# Patient Record
Sex: Male | Born: 1980 | Race: White | Hispanic: No | Marital: Married | Smoking: Never smoker
Health system: Southern US, Community
[De-identification: ages and names within clinical notes are randomized; demographics above are authoritative.]

## PROBLEM LIST (undated history)

## (undated) DIAGNOSIS — J45909 Unspecified asthma, uncomplicated: Secondary | ICD-10-CM

## (undated) HISTORY — PX: NO PAST SURGERIES: SHX2092

---

## 2010-09-14 ENCOUNTER — Emergency Department: Payer: Self-pay | Admitting: Emergency Medicine

## 2017-07-27 ENCOUNTER — Other Ambulatory Visit: Payer: Self-pay

## 2017-07-27 ENCOUNTER — Ambulatory Visit
Admission: EM | Admit: 2017-07-27 | Discharge: 2017-07-27 | Disposition: A | Discharge status: Home / Self Care | Payer: BLUE CROSS/BLUE SHIELD | Attending: Family Medicine | Admitting: Family Medicine

## 2017-07-27 ENCOUNTER — Ambulatory Visit (INDEPENDENT_AMBULATORY_CARE_PROVIDER_SITE_OTHER): Payer: BLUE CROSS/BLUE SHIELD

## 2017-07-27 DIAGNOSIS — R059 Cough, unspecified: Secondary | ICD-10-CM

## 2017-07-27 DIAGNOSIS — R05 Cough: Secondary | ICD-10-CM | POA: Diagnosis not present

## 2017-07-27 DIAGNOSIS — J45901 Unspecified asthma with (acute) exacerbation: Secondary | ICD-10-CM

## 2017-07-27 MED ORDER — IPRATROPIUM-ALBUTEROL 0.5-2.5 (3) MG/3ML IN SOLN
3.0000 mL | Freq: Once | RESPIRATORY_TRACT | Status: AC
  Administered 2017-07-27: 3 mL via RESPIRATORY_TRACT

## 2017-07-27 MED ORDER — HYDROCOD POLST-CPM POLST ER 10-8 MG/5ML PO SUER: 5.0000 mL | Freq: Every evening | ORAL | 0 refills | Status: DC | PRN

## 2017-07-27 MED ORDER — DOXYCYCLINE HYCLATE 100 MG PO CAPS: 100.0000 mg | ORAL_CAPSULE | Freq: Two times a day (BID) | ORAL | 0 refills | Status: DC

## 2017-07-27 MED ORDER — PREDNISONE 10 MG PO TABS: ORAL_TABLET | ORAL | 0 refills | Status: DC

## 2017-07-27 MED ORDER — ALBUTEROL SULFATE HFA 108 (90 BASE) MCG/ACT IN AERS: 2.0000 | INHALATION_SPRAY | RESPIRATORY_TRACT | 0 refills | Status: DC | PRN

## 2017-07-27 NOTE — ED Provider Notes (Signed)
[facility] ____________________________________________  Time seen: Approximately 11:35 AM  I have reviewed the triage vital signs and the nursing notes.   HISTORY  Chief Complaint Cough (APPT)  HPI Jon Hardy is a 36 y.o. male presenting for evaluation of 2-3 weeks of nasal congestion, cough and chest congestion.  Patient reports that the last week he feels like the chest congestion has increased.  Patient states that he has been coughing up very thick greenish yellowish mucus.  States first thing in the morning he takes a hot shower and then tries to cough up as much mucus as he can, stating that he strains himself to cough up, and occasionally has some pinkish coloration to his mucus.  States no other pinkish mucus throughout the rest today.  Denies any other hemoptysis type episodes.  States over the last few days he has been having intermittent wheezing, chest tightness and accompanying shortness of breath.  States shortness of breath increases with coughing and wheezing sensation.  Denies chest pain.  States he does have a history of asthma, mostly as a child.  Not a smoker.  No history of PE, DVT or family history of PE or DVT.  Denies recent immobilization, recent surgeries or travel reports otherwise feels well.  Reports continues to eat and drink well.  States has tried some intermittent over-the-counter Sudafed and decongestant as well as an albuterol inhaler from 2014. Denies sick contacts. Denies abdominal pain, dysuria, extremity pain, extremity swelling or rash. Denies recent sickness. Denies recent antibiotic use.   History reviewed. No pertinent past medical history.  There are no active problems to display for this patient.   Past Surgical History:  Procedure Laterality Date  . NO PAST SURGERIES       No current facility-administered medications for this encounter.   Current Outpatient Medications:  .  albuterol (PROVENTIL HFA;VENTOLIN HFA) 108 (90  Base) MCG/ACT inhaler, Inhale 2 puffs into the lungs every 4 (four) hours as needed for wheezing or shortness of breath., Disp: 1 Inhaler, Rfl: 0 .  chlorpheniramine-HYDROcodone (TUSSIONEX PENNKINETIC ER) 10-8 MG/5ML SUER, Take 5 mLs by mouth at bedtime as needed. do not drive or operate machinery while taking as can cause drowsiness., Disp: 75 mL, Rfl: 0 .  doxycycline (VIBRAMYCIN) 100 MG capsule, Take 1 capsule (100 mg total) by mouth 2 (two) times daily., Disp: 20 capsule, Rfl: 0 .  predniSONE (DELTASONE) 10 MG tablet, Start 60 mg po day one, then 50 mg po day two, taper by 10 mg daily until complete., Disp: 21 tablet, Rfl: 0  Allergies Patient has no known allergies.  Family History  Problem Relation Age of Onset  . Hypertension Mother   . Hypertension Father     Social History Social History   Tobacco Use  . Smoking status: Never Smoker  . Smokeless tobacco: Never Used  Substance Use Topics  . Alcohol use: No    Frequency: Never  . Drug use: No    Review of Systems Constitutional: No fever/chills ENT: No sore throat. Cardiovascular: Denies chest pain. Respiratory: As above. Gastrointestinal: No abdominal pain.  No nausea, no vomiting.   Genitourinary: Negative for dysuria. Musculoskeletal: Negative for back pain.  ____________________________________________   PHYSICAL EXAM:  VITAL SIGNS: ED Triage Vitals  Enc Vitals Group     BP 07/27/17 1117 (!) 136/93     Pulse Rate 07/27/17 1117 (!) 103     Resp 07/27/17 1117 18     Temp 07/27/17 1117 98.3 F (  36.8 C)     Temp Source 07/27/17 1117 Oral     SpO2 07/27/17 1117 96 %     Weight 07/27/17 1114 290 lb (131.5 kg)     Height 07/27/17 1114 6' (1.829 m)     Head Circumference --      Peak Flow --      Pain Score 07/27/17 1114 5     Pain Loc --      Pain Edu? --      Excl. in GC? --    Vitals:   07/27/17 1114 07/27/17 1117 07/27/17 1217  BP:  (!) 136/93 137/88  Pulse:  (!) 103 (!) 109  Resp:  18 16  Temp:   98.3 F (36.8 C)   TempSrc:  Oral   SpO2:  96% 96%  Weight: 290 lb (131.5 kg)    Height: 6' (1.829 m)      Constitutional: Alert and oriented. Well appearing and in no acute distress. Eyes: Conjunctivae are normal.  Head: Atraumatic. No sinus tenderness to palpation. No swelling. No erythema.  Ears: no erythema, normal TMs bilaterally.   Nose:Nasal congestion   Mouth/Throat: Mucous membranes are moist. No pharyngeal erythema. No tonsillar swelling or exudate.  Neck: No stridor.  No cervical spine tenderness to palpation. Hematological/Lymphatic/Immunilogical: No cervical lymphadenopathy. Cardiovascular: Normal rate, regular rhythm. Grossly normal heart sounds.  Good peripheral circulation. Respiratory: Normal respiratory effort.  No retractions. Good air movement.  Scattered inspiratory and expiratory wheezes.  Mild scattered rhonchi, rhonchi increased right lower base.  Occasional dry cough with bronchospasm noted. Musculoskeletal: Ambulatory with steady gait. No cervical, thoracic or lumbar tenderness to palpation. No lower extremity tenderness or edema.  Neurologic:  Normal speech and language. No gait instability. Skin:  Skin appears warm, dry and intact. No rash noted. Psychiatric: Mood and affect are normal. Speech and behavior are normal. ___________________________________________   LABS (all labs ordered are listed, but only abnormal results are displayed)  Labs Reviewed - No data to display  RADIOLOGY  Dg Chest 2 View  Result Date: 07/27/2017 CLINICAL DATA:  Cough and wheezing EXAM: CHEST  2 VIEW COMPARISON:  None. FINDINGS: Lungs are clear. Heart size and pulmonary vascularity are normal. No adenopathy. No bone lesions. IMPRESSION: No edema or consolidation. Electronically Signed   By: Bretta BangWilliam  Woodruff III M.D.   On: 07/27/2017 12:03   ____________________________________________   PROCEDURES Procedures    INITIAL IMPRESSION / ASSESSMENT AND PLAN / ED  COURSE  Pertinent labs & imaging results that were available during my care of the patient were reviewed by me and considered in my medical decision making (see chart for details). Chest x-ray reviewed, per radiologist no edema or consolidation.   well-appearing patient.  No acute distress.  Will evaluate chest x-ray.  DuoNeb given in urgent care.  After DuoNeb, wheezes improved.  Will treat patient with oral albuterol inhaler as needed, prednisone taper, as needed Tussionex and oral doxycycline.  Encourage rest, fluids, supportive care.  Discussed very strict follow-up and return parameters.Discussed indication, risks and benefits of medications with patient.  Discussed follow up with Primary care physician this week. Discussed follow up and return parameters including no resolution or any worsening concerns. Patient verbalized understanding and agreed to plan.   ____________________________________________   FINAL CLINICAL IMPRESSION(S) / ED DIAGNOSES  Final diagnoses:  Asthmatic bronchitis with acute exacerbation, unspecified asthma severity, unspecified whether persistent  Cough     ED Discharge Orders  Ordered    albuterol (PROVENTIL HFA;VENTOLIN HFA) 108 (90 Base) MCG/ACT inhaler  Every 4 hours PRN     07/27/17 1213    predniSONE (DELTASONE) 10 MG tablet     07/27/17 1213    doxycycline (VIBRAMYCIN) 100 MG capsule  2 times daily     07/27/17 1213    chlorpheniramine-HYDROcodone (TUSSIONEX PENNKINETIC ER) 10-8 MG/5ML SUER  At bedtime PRN     07/27/17 1213       Note: This dictation was prepared with Dragon dictation along with smaller phrase technology. Any transcriptional errors that result from this process are unintentional.         Renford DillsMiller, Mylah Baynes, NP 07/27/17 1344

## 2017-07-27 NOTE — ED Triage Notes (Signed)
Patient complains of cough, congestion, shortness of breath. Patient reports history of asthma. Patient states that symptoms started week of thanksgiving and have been worsening.

## 2017-07-27 NOTE — Discharge Instructions (Signed)
Take medication as prescribed. Rest. Drink plenty of fluids.   Follow up with your primary care physician this week as needed. Return to Urgent care or Emergency for fevers, coughing up blood, chest pain, new or worsening concerns.

## 2017-08-15 ENCOUNTER — Encounter: Payer: Self-pay | Admitting: Gynecology

## 2017-08-15 ENCOUNTER — Ambulatory Visit
Admission: EM | Admit: 2017-08-15 | Discharge: 2017-08-15 | Disposition: A | Discharge status: Home / Self Care | Payer: BLUE CROSS/BLUE SHIELD | Attending: Family Medicine | Admitting: Family Medicine

## 2017-08-15 ENCOUNTER — Other Ambulatory Visit: Payer: Self-pay

## 2017-08-15 DIAGNOSIS — R05 Cough: Secondary | ICD-10-CM

## 2017-08-15 DIAGNOSIS — J069 Acute upper respiratory infection, unspecified: Secondary | ICD-10-CM

## 2017-08-15 MED ORDER — FLUTICASONE PROPIONATE 50 MCG/ACT NA SUSP: 2.0000 | Freq: Every day | NASAL | 0 refills | Status: DC

## 2017-08-15 MED ORDER — ALBUTEROL SULFATE HFA 108 (90 BASE) MCG/ACT IN AERS: 1.0000 | INHALATION_SPRAY | Freq: Four times a day (QID) | RESPIRATORY_TRACT | 0 refills | Status: DC | PRN

## 2017-08-15 MED ORDER — BENZONATATE 200 MG PO CAPS: ORAL_CAPSULE | ORAL | 0 refills | Status: DC

## 2017-08-15 NOTE — ED Triage Notes (Signed)
Per patient was seen x 2 weeks ago. Patient stated finished medication that was given and not feeling better. Pt. C/o cough and sinusitis. Per patient was not able to pick up inhaler at pharmacy because insurance does not covered.

## 2017-08-15 NOTE — ED Provider Notes (Signed)
[facility]    CSN: [id number] Arrival date & time: 08/15/17  1036     History   Chief Complaint Chief Complaint  Patient presents with  . Cough  . Sinusitis    HPI Jon Hardy is a 36 y.o. male.   HPI  This a 36 year old male who presents with continued cough and sinus drainage although he does feel improved from when he was first seen here on 12 3.  At that time he was diagnosed with an asthmatic bronchitis.  He was treated with doxycycline and prednisone albuterol inhaler which she was unable to obtain due to insurance problems and Tussionex cough syrup.  He states the majority of his problem now is in his passages but does on occasion continue to cough typically with paroxysms during the day.  Most is coughing however is in the morning.  He continues to cough yellow mucus.  He is a non-smoker.  He does not have shortness of breath.       History reviewed. No pertinent past medical history.  There are no active problems to display for this patient.   Past Surgical History:  Procedure Laterality Date  . NO PAST SURGERIES         Home Medications    Prior to Admission medications   Medication Sig Start Date End Date Taking? Authorizing Provider  albuterol (PROVENTIL HFA;VENTOLIN HFA) 108 (90 Base) MCG/ACT inhaler Inhale 1-2 puffs into the lungs every 6 (six) hours as needed for wheezing or shortness of breath. Use with spacer 08/15/17   Lutricia Feiloemer, William P, PA-C  benzonatate (TESSALON) 200 MG capsule Take one cap TID PRN cough 08/15/17   Ovid Curdoemer, William P, PA-C  fluticasone [facility](FLONASE) 50 MCG/ACT nasal spray Place 2 sprays into both nostrils daily. 08/15/17   Lutricia Feiloemer, William P, PA-C    Family History Family History  Problem Relation Age of Onset  . Hypertension Mother   . Hypertension Father     Social History Social History   Tobacco Use  . Smoking status: Never Smoker  . Smokeless tobacco: Never Used  Substance Use Topics  . Alcohol use: No     Frequency: Never  . Drug use: No     Allergies   Patient has no known allergies.   Review of Systems Review of Systems  Constitutional: Positive for activity change. Negative for chills, fatigue and fever.  Respiratory: Positive for cough.   All other systems reviewed and are negative.    Physical Exam Triage Vital Signs ED Triage Vitals  Enc Vitals Group     BP 08/15/17 1047 130/78     Pulse Rate 08/15/17 1047 (!) 103     Resp 08/15/17 1047 18     Temp 08/15/17 1047 97.8 F (36.6 C)     Temp Source 08/15/17 1047 Oral     SpO2 08/15/17 1047 98 %     Weight 08/15/17 1047 290 lb (131.5 kg)     Height --      Head Circumference --      Peak Flow --      Pain Score 08/15/17 1206 5     Pain Loc --      Pain Edu? --      Excl. in GC? --    No data found.  Updated Vital Signs BP 130/78 (BP Location: Left Arm)   Pulse (!) 103   Temp 97.8 F (36.6 C) (Oral)   Resp 18   Wt 290 lb (131.5  kg)   SpO2 98%   BMI 39.33 kg/m   Visual Acuity Right Eye Distance:   Left Eye Distance:   Bilateral Distance:    Right Eye Near:   Left Eye Near:    Bilateral Near:     Physical Exam  Constitutional: He is oriented to person, place, and time. He appears well-developed and well-nourished. No distress.  HENT:  Head: Normocephalic.  Right Ear: External ear normal.  Left Ear: External ear normal.  Nose: Nose normal.  Mouth/Throat: Oropharynx is clear and moist. No oropharyngeal exudate.  Eyes: Pupils are equal, round, and reactive to light. Right eye exhibits no discharge. Left eye exhibits no discharge.  Neck: Normal range of motion.  Pulmonary/Chest: Effort normal. He has rales.  Patient has fine crackles in the right base.  Musculoskeletal: Normal range of motion.  Lymphadenopathy:    He has no cervical adenopathy.  Neurological: He is alert and oriented to person, place, and time.  Skin: Skin is warm and dry. He is not diaphoretic.  Psychiatric: He has a normal  mood and affect. His behavior is normal. Judgment and thought content normal.  Nursing note and vitals reviewed.    UC Treatments / Results  Labs (all labs ordered are listed, but only abnormal results are displayed) Labs Reviewed - No data to display  EKG  EKG Interpretation None       Radiology No results found.  Procedures Procedures (including critical care time)  Medications Ordered in UC Medications - No data to display   Initial Impression / Assessment and Plan / UC Course  I have reviewed the triage vital signs and the nursing notes.  Pertinent labs & imaging results that were available during my care of the patient were reviewed by me and considered in my medical decision making (see chart for details).     Plan: 1. Test/x-ray results and diagnosis reviewed with patient 2. rx as per orders; risks, benefits, potential side effects reviewed with patient 3. Recommend supportive treatment with use of Nettie pot and Flonase.  Told patient I would keep this regimen for the next 3-4 weeks.  This point time am not going to place him on any other antibiotics.  This is very likely a viral illness.  I would like him to have the albuterol inhaler however he states insurance does not cover this and his plan.  I have written for another albuterol inhaler and hopefully we can find one that will be covered by his insurance.  I have recommended that he find a primary care physician that he can see in follow-up but if he is unable and he is not improving he will return to our clinic or the emergency room. 4. F/u prn if symptoms worsen or don't improve   Final Clinical Impressions(s) / UC Diagnoses   Final diagnoses:  Upper respiratory tract infection, unspecified type    ED Discharge Orders        Ordered    benzonatate (TESSALON) 200 MG capsule     08/15/17 1157    fluticasone (FLONASE) 50 MCG/ACT nasal spray  Daily     08/15/17 1157    albuterol (PROVENTIL HFA;VENTOLIN  HFA) 108 (90 Base) MCG/ACT inhaler  Every 6 hours PRN    Comments:  Provide spacer and instructions to patient   08/15/17 1157       Controlled Substance Prescriptions [place] Controlled Substance Registry consulted? Not Applicable   Lutricia FeilRoemer, William P, PA-C 08/15/17 1221

## 2017-09-14 ENCOUNTER — Emergency Department
Admission: EM | Admit: 2017-09-14 | Discharge: 2017-09-14 | Disposition: A | Discharge status: Home / Self Care | Payer: BLUE CROSS/BLUE SHIELD | Attending: Emergency Medicine | Admitting: Emergency Medicine

## 2017-09-14 ENCOUNTER — Other Ambulatory Visit: Payer: Self-pay

## 2017-09-14 ENCOUNTER — Emergency Department: Payer: BLUE CROSS/BLUE SHIELD

## 2017-09-14 ENCOUNTER — Ambulatory Visit (INDEPENDENT_AMBULATORY_CARE_PROVIDER_SITE_OTHER)
Admission: EM | Admit: 2017-09-14 | Discharge: 2017-09-14 | Disposition: A | Discharge status: Other Acute Inpatient Hospital | Payer: BLUE CROSS/BLUE SHIELD | Source: Home / Self Care | Attending: Emergency Medicine | Admitting: Emergency Medicine

## 2017-09-14 ENCOUNTER — Encounter: Payer: Self-pay | Admitting: Emergency Medicine

## 2017-09-14 DIAGNOSIS — R079 Chest pain, unspecified: Secondary | ICD-10-CM | POA: Insufficient documentation

## 2017-09-14 DIAGNOSIS — R0602 Shortness of breath: Secondary | ICD-10-CM | POA: Diagnosis not present

## 2017-09-14 DIAGNOSIS — J45909 Unspecified asthma, uncomplicated: Secondary | ICD-10-CM | POA: Insufficient documentation

## 2017-09-14 DIAGNOSIS — R110 Nausea: Secondary | ICD-10-CM | POA: Insufficient documentation

## 2017-09-14 DIAGNOSIS — Z79899 Other long term (current) drug therapy: Secondary | ICD-10-CM | POA: Diagnosis not present

## 2017-09-14 HISTORY — DX: Unspecified asthma, uncomplicated: J45.909

## 2017-09-14 LAB — CBC
HCT: 49 % (ref 40.0–52.0)
HEMOGLOBIN: 16.9 g/dL (ref 13.0–18.0)
MCH: 29.9 pg (ref 26.0–34.0)
MCHC: 34.4 g/dL (ref 32.0–36.0)
MCV: 86.9 fL (ref 80.0–100.0)
PLATELETS: 182 10*3/uL (ref 150–440)
RBC: 5.64 MIL/uL (ref 4.40–5.90)
RDW: 14.1 % (ref 11.5–14.5)
WBC: 9.9 10*3/uL (ref 3.8–10.6)

## 2017-09-14 LAB — COMPREHENSIVE METABOLIC PANEL
ALT: 59 U/L (ref 17–63)
AST: 35 U/L (ref 15–41)
Albumin: 4.7 g/dL (ref 3.5–5.0)
Alkaline Phosphatase: 85 U/L (ref 38–126)
Anion gap: 12 (ref 5–15)
BUN: 15 mg/dL (ref 6–20)
CO2: 21 mmol/L — AB (ref 22–32)
CREATININE: 1.24 mg/dL (ref 0.61–1.24)
Calcium: 9.3 mg/dL (ref 8.9–10.3)
Chloride: 106 mmol/L (ref 101–111)
GFR calc non Af Amer: >60 mL/min (ref >60)
Glucose, Bld: 118 mg/dL — ABNORMAL HIGH (ref 65–99)
POTASSIUM: 3.8 mmol/L (ref 3.5–5.1)
Sodium: 139 mmol/L (ref 135–145)
Total Bilirubin: 1 mg/dL (ref 0.3–1.2)
Total Protein: 7.6 g/dL (ref 6.5–8.1)

## 2017-09-14 LAB — TROPONIN I: Troponin I: <0.03 ng/mL (ref <0.03)

## 2017-09-14 MED ORDER — ASPIRIN 81 MG PO CHEW: 324.0000 mg | CHEWABLE_TABLET | Freq: Once | ORAL | Status: DC

## 2017-09-14 MED ORDER — IOPAMIDOL (ISOVUE-370) INJECTION 76%
75.0000 mL | Freq: Once | INTRAVENOUS | Status: AC | PRN
  Administered 2017-09-14: 75 mL via INTRAVENOUS
  Filled 2017-09-14: qty 75

## 2017-09-14 MED ORDER — ASPIRIN 81 MG PO CHEW
324.0000 mg | CHEWABLE_TABLET | Freq: Once | ORAL | Status: AC
  Administered 2017-09-14: 324 mg via ORAL

## 2017-09-14 NOTE — Discharge Instructions (Signed)
I have given you aspirin here.  Go immediately to the [facility].  I am going to let them know that you are on your way.  Let them know if your chest pain changes or gets worse.

## 2017-09-14 NOTE — ED Notes (Signed)
Pt back in room from CT 

## 2017-09-14 NOTE — ED Notes (Signed)
Pt ambulatory at discharge. Verbalized understanding of discharge instructions and follow-up care. VSS. Skin warm and dry. A&O x4. Denying pain.

## 2017-09-14 NOTE — ED Provider Notes (Signed)
[facility] Emergency Department Provider Note ____________________________________________   I have reviewed the triage vital signs and the triage nursing note.  HISTORY  Chief Complaint Chest Pain   Historian Patient  HPI Jon Hardy is a 37 y.o. male presents for evaluation of chest pain that started around 2 AM this morning while he was at work.  He presented to urgent care who referred him further for evaluation today for cardiac evaluation and evaluation of chest pain given a couple of risk factors including hyperlipidemia and hypertension.  Patient states he experienced a central chest pressure which was just left of the sternum and then spread across over to his left shoulder.  Mild nausea.  When he was looking up what this could be he came across the possibility of dizziness and when he read that he started to feel dizzy.  This morning he did go to urgent care and was referred here for further evaluation.  No history of known PE.  No history of coronary artery disease.  He does report history of bronchitis over the past 2 months, multiple rounds of prednisone which he says prevents him from getting good sleep.     Past Medical History:  Diagnosis Date  . Asthma     There are no active problems to display for this patient.   Past Surgical History:  Procedure Laterality Date  . NO PAST SURGERIES      Prior to Admission medications   Medication Sig Start Date End Date Taking? Authorizing Provider  albuterol (PROVENTIL HFA;VENTOLIN HFA) 108 (90 Base) MCG/ACT inhaler Inhale 1-2 puffs into the lungs every 6 (six) hours as needed for wheezing or shortness of breath. Use with spacer 08/15/17   Lutricia Feiloemer, William P, PA-C  fluticasone (FLONASE) 50 MCG/ACT nasal spray Place 2 sprays into both nostrils daily. 08/15/17   Lutricia Feiloemer, William P, PA-C    No Active Allergies  Family History  Problem Relation Age of Onset  . Hypertension Mother   .  Hypertension Father     Social History Social History   Tobacco Use  . Smoking status: Never Smoker  . Smokeless tobacco: Never Used  Substance Use Topics  . Alcohol use: No    Frequency: Never  . Drug use: No    Review of Systems  Constitutional: Negative for fever. Eyes: Negative for visual changes. ENT: Negative for sore throat. Cardiovascular: Positive for chest pain earlier today, states that he went walking to see if it would get worse and it did not.  No worsening with walking stairs.  Much better now, mild soreness across the top of his left chest wall when he palpates the muscles there. Respiratory: He has had a mild dry cough/bronchitis over the last several months, somewhat better now but still some mild shortness of breath. Gastrointestinal: Negative for abdominal pain, vomiting and diarrhea. Genitourinary: Negative for dysuria. Musculoskeletal: Negative for back pain. Skin: Negative for rash. Neurological: Negative for headache.  ____________________________________________   PHYSICAL EXAM:  VITAL SIGNS: ED Triage Vitals  Enc Vitals Group     BP 09/14/17 1334 (!) 142/91     Pulse Rate 09/14/17 1334 86     Resp 09/14/17 1334 16     Temp 09/14/17 1334 98.2 F (36.8 C)     Temp Source 09/14/17 1334 Oral     SpO2 09/14/17 1334 95 %     Weight 09/14/17 1347 290 lb (131.5 kg)     Height 09/14/17 1347 6' (1.829 m)  Head Circumference --      Peak Flow --      Pain Score 09/14/17 1346 3     Pain Loc --      Pain Edu? --      Excl. in GC? --      Constitutional: Alert and oriented. Well appearing and in no distress. HEENT   Head: Normocephalic and atraumatic.      Eyes: Conjunctivae are normal. Pupils equal and round.       Ears:         Nose: No congestion/rhinnorhea.   Mouth/Throat: Mucous membranes are moist.   Neck: No stridor. Cardiovascular/Chest: Normal rate, regular rhythm.  No murmurs, rubs, or gallops. Respiratory: Normal  respiratory effort without tachypnea nor retractions.  Some type breath sounds without obvious wheezing.  No rales or rhonchi. Gastrointestinal: Soft. No distention, no guarding, no rebound. Nontender.  Obese. Genitourinary/rectal:Deferred Musculoskeletal: Nontender with normal range of motion in all extremities. No joint effusions.  No lower extremity tenderness.  No edema. Neurologic:  Normal speech and language. No gross or focal neurologic deficits are appreciated. Skin:  Skin is warm, dry and intact. No rash noted. Psychiatric: Mood and affect are normal. Speech and behavior are normal. Patient exhibits appropriate insight and judgment.   ____________________________________________  LABS (pertinent positives/negatives) I, Governor Rooks, MD the attending physician have reviewed the labs noted below.  Labs Reviewed  COMPREHENSIVE METABOLIC PANEL - Abnormal; Notable for the following components:      Result Value   CO2 21 (*)    Glucose, Bld 118 (*)    All other components within normal limits  CBC  TROPONIN I    ____________________________________________    EKG I, Governor Rooks, MD, the attending physician have personally viewed and interpreted all ECGs.  86 bpm.  Normal sinus rhythm.  Narrow QRS.  Normal axis.  Normal ST and T wave. ____________________________________________  RADIOLOGY All Xrays were viewed by me.  Imaging interpreted by Radiologist, and I, Governor Rooks, MD the attending physician have reviewed the radiologist interpretation noted below.  Chest x-ray two-view:  FINDINGS: The heart size and mediastinal contours are within normal limits. Compared to the prior chest x-ray, there is increase in pulmonary vascular and interstitial prominence suggestive of mild interstitial edema. No focal airspace consolidation, nodule, pleural fluid or pneumothorax identified. The bony thorax is unremarkable. The visualized skeletal structures are  unremarkable.  IMPRESSION: Findings suggestive of mild pulmonary interstitial edema.  Chest CT: Pending __________________________________________  PROCEDURES  Procedure(s) performed: None  Critical Care performed: None   ____________________________________________  ED COURSE / ASSESSMENT AND PLAN  Pertinent labs & imaging results that were available during my care of the patient were reviewed by me and considered in my medical decision making (see chart for details).    Patient looks exhausted because he has been up since 10 PM last night.  He describes an episode of nonspecific chest discomfort associated with some mild nausea possibly shortness of breath although it sound like the shortness of breath has been sort of an ongoing waxing and waning issue for the past several months, that occurred essentially at rest overnight and did not worsen with any exertional activities.  His EKG is overall reassuring.  His initial laboratory workup including troponin drawn well after symptoms started at 2 AM is reassuring and negative.  I have a low suspicion for ACS, however given his symptoms and couple of risk factors, I have discussed with him that I would recommend  he have close follow-up with cardiology this week and will give him the office number for follow-up.  From the standpoint of his complaint of intermittent breathing issues, we discussed ruling out pulmonary embolism and discussed risk versus benefit of chest CT and chose to proceed.  We discussed that his chest x-ray showed the possibility of pulmonary edema, however he is not hypoxic.  Chest CT may give some more indication of this, as will his echocardiogram with a cardiologist.  He does have a history of GERD, and symptoms could have been related to that.  Patient care transferred to Dr. Don Perking at shift change.  Dispo pending CT for PE.  If negative/reassuring, may be discharged with my prepared discharge  instructions.  DIFFERENTIAL DIAGNOSIS: Differential diagnosis includes, but is not limited to, ACS, aortic dissection, pulmonary embolism, cardiac tamponade, pneumothorax, pneumonia, pericarditis, myocarditis, GI-related causes including esophagitis/gastritis, and musculoskeletal chest wall pain.    Differential includes, but is not limited to, viral syndrome, bronchitis including COPD exacerbation, pneumonia, reactive airway disease including asthma, CHF including exacerbation with or without pulmonary/interstitial edema, pneumothorax, ACS, thoracic trauma, and pulmonary embolism.  CONSULTATIONS: None  Patient / Family / Caregiver informed of clinical course, medical decision-making process, and agree with plan.   I discussed return precautions, follow-up instructions, and discharge instructions with patient and/or family.  Discharge Instructions (prepared by me if patient is dispositioned for home):  You are evaluated for chest discomfort, and although no certain cause was found, your exam and evaluation are overall reassuring in the emergency department today.  Return to the emergency room immediately for any worsening condition including new or worsening chest pain, nausea, sweats, dizziness or passing out, trouble breathing or shortness of breath, vomiting blood, dizziness or passing out, or any other symptoms concerning to you.    ___________________________________________   FINAL CLINICAL IMPRESSION(S) / ED DIAGNOSES   Final diagnoses:  Nonspecific chest pain      ___________________________________________        Note: This dictation was prepared with Dragon dictation. Any transcriptional errors that result from this process are unintentional    Governor Rooks, MD 09/14/17 [phone]

## 2017-09-14 NOTE — Discharge Instructions (Signed)
You are evaluated for chest discomfort, and although no certain cause was found, your exam and evaluation are overall reassuring in the emergency department today.  Return to the emergency room immediately for any worsening condition including new or worsening chest pain, nausea, sweats, dizziness or passing out, trouble breathing or shortness of breath, vomiting blood, dizziness or passing out, or any other symptoms concerning to you.

## 2017-09-14 NOTE — ED Notes (Signed)
Patient transported to CT 

## 2017-09-14 NOTE — ED Provider Notes (Signed)
-----------------------------------------   6:18 PM on 09/14/2017 -----------------------------------------   Blood pressure (!) 117/52, pulse 86, temperature 98.6 F (37 C), temperature source Oral, resp. rate 18, height 6' (1.829 m), weight 131.5 kg (290 lb), SpO2 98 %.  Assuming care from Dr. Shaune PollackLord of Cindi CarbonChris Hardy is a 37 y.o. male with a chief complaint of Chest Pain .    Please refer to H&P by previous MD for further details.  The current plan of care is to f/u result of CTA and 2nd troponin.   CT Angio Chest PE W/Cm &/Or Wo Cm (Final result)  Result time 09/14/17 17:14:32  Final result by Ulyses SouthwardBoles, Mark, MD (09/14/17 17:14:32)           Narrative:   CLINICAL DATA: LEFT chest pain and pressure, shortness of breath, onset of symptoms last night  EXAM: CT ANGIOGRAPHY CHEST WITH CONTRAST  TECHNIQUE: Multidetector CT imaging of the chest was performed using the standard protocol during bolus administration of intravenous contrast. Multiplanar CT image reconstructions and MIPs were obtained to evaluate the vascular anatomy.  CONTRAST: 75mL ISOVUE-370 IOPAMIDOL (ISOVUE-370) INJECTION 76% IV  COMPARISON: None  FINDINGS: Cardiovascular: Aorta normal caliber without aneurysm or dissection. Question mild enlargement of cardiac chambers. No pericardial effusion. Pulmonary arteries suboptimally opacified but grossly patent. No large or central pulmonary emboli identified. Unable to exclude small peripheral emboli by this exam.  Mediastinum/Nodes: Esophagus unremarkable. Base of cervical region normal appearance. No thoracic adenopathy.  Lungs/Pleura: Generally low lung volumes. Minimal scattered mosaic attenuation in both lungs which may represent atelectasis or minimal infiltrate. No pleural effusion or pneumothorax. No obvious pulmonary mass.  Upper Abdomen: Unremarkable  Musculoskeletal: Normal appearance  Review of the MIP images confirms the above  findings.  IMPRESSION: No large or central pulmonary emboli are identified.  Suboptimal opacification of peripheral pulmonary arterial branches, unable to completely exclude small and peripheral emboli by this study.  Low lung volumes with scattered areas of minimal mosaic attenuation in both lungs which could be related atelectasis or minimal infiltrate.   Electronically Signed By: Ulyses SouthwardMark Boles M.D. On: 09/14/2017 17:14           Discussed with patient findings of CT scan. Troponin x 2 negative. Patient remains well appearing. Will dc home with f/u with Cardiology.Discussed return precautions with patient.        Don PerkingVeronese, [place], MD 09/14/17 [phone]

## 2017-09-14 NOTE — ED Provider Notes (Signed)
HPI  SUBJECTIVE:  Jon Hardy is a 37 y.o. male who presents with left-sided chest pain described as pressure, heaviness, starting several hours ago.  He reports some shortness of breath.  He states that his breath feels tight with exhalation.  He reports nausea and states that the chest pain goes up into his left neck.  He does report belching.  No aggravating or alleviating factors.  Patient has not tried anything for this.  No diaphoresis, radiation to his arm, back.  No exertional, positional component.  No water brash, burning chest pain, abdominal pain.  No coughing, wheezing, shortness of breath.  No dyspnea on exertion.  No palpitations, presyncope, syncope.  No fevers.  He reports vomiting 48 hours prior to the symptoms starting, but was asymptomatic in between.  Has never had symptoms like this before.  No calf pain, swelling, surgery in the past 4 weeks, prolonged immobilization, hemoptysis, history of PE, DVT, cancer.  No exogenous estrogen.  He has a past medical history of obesity, asthma, states this does not feel like an asthma exacerbation.  Also borderline hypertension, hypercholesterolemia, occasional GERD.  States that does not feel like heartburn.  No history of diabetes, HIV, coronary disease, MI, atrial fibrillation, arrhythmia.  Family history negative for early MI.  PMD: None.  History reviewed. No pertinent past medical history.  Past Surgical History:  Procedure Laterality Date  . NO PAST SURGERIES      Family History  Problem Relation Age of Onset  . Hypertension Mother   . Hypertension Father     Social History   Tobacco Use  . Smoking status: Never Smoker  . Smokeless tobacco: Never Used  Substance Use Topics  . Alcohol use: No    Frequency: Never  . Drug use: No    No current facility-administered medications for this encounter.   Current Outpatient Medications:  .  albuterol (PROVENTIL HFA;VENTOLIN HFA) 108 (90 Base) MCG/ACT inhaler, Inhale 1-2  puffs into the lungs every 6 (six) hours as needed for wheezing or shortness of breath. Use with spacer, Disp: 1 Inhaler, Rfl: 0 .  fluticasone (FLONASE) 50 MCG/ACT nasal spray, Place 2 sprays into both nostrils daily., Disp: 16 g, Rfl: 0  No Known Allergies   ROS  As noted in HPI.   Physical Exam  BP (!) 166/94 (BP Location: Right Arm)   Pulse 90   Temp 97.7 F (36.5 C) (Oral)   Resp 18   Ht 6' (1.829 m)   Wt 290 lb (131.5 kg)   SpO2 100%   BMI 39.33 kg/m   Constitutional: Well developed, well nourished, no acute distress.  Obese.  Eyes: PERRL, EOMI, conjunctiva normal bilaterally HENT: Normocephalic, atraumatic,mucus membranes moist Respiratory: Clear to auscultation bilaterally, no rales, no wheezing, no rhonchi.  No chest wall tenderness. Cardiovascular: Normal rate and rhythm, no murmurs, no gallops, no rubs GI: Soft, nondistended, normal bowel sounds, nontender, no rebound, no guarding no pulsatile masses.  skin: No rash, skin intact Musculoskeletal: Calves symmetric, nontender no edema,  no deformities Neurologic: Alert & oriented x 3, CN II-XII grossly intact, no motor deficits, sensation grossly intact Psychiatric: Speech and behavior appropriate   ED Course   Medications  aspirin chewable tablet 324 mg (324 mg Oral Given 09/14/17 1244)    Orders Placed This Encounter  Procedures  . ED EKG    Standing Status:   Standing    Number of Occurrences:   1    Order Specific Question:  Reason for Exam    Answer:   Chest Pain  . EKG 12-Lead    Standing Status:   Standing    Number of Occurrences:   1   No results found for this or any previous visit (from the past 24 hour(s)). No results found.  ED Clinical Impression  Chest pain, unspecified type   ED Assessment/Plan   EKG: Sinus arrhythmia, rate 86.  Normal axis, normal intervals.  No hypertrophy.  No ST-T wave changes.  No previous EKG for comparison.  Patient was symptomatic while EKG was  obtained.  Patient has several cardiac risk factors including hypertension and hypercholesterolemia, he states that this does not feel like his asthma or like heartburn.  [facility] for a comprehensive evaluation.  Doubt PE.  Feel that he is stable to go by private vehicle.  They have agreed to go directly there.  Giving aspirin 324 mg p.o. here.  Notified the ED.  Meds ordered this encounter  Medications  . DISCONTD: aspirin chewable tablet 324 mg  . aspirin chewable tablet 324 mg    *This clinic note was created using Scientist, clinical (histocompatibility and immunogenetics)Dragon dictation software. Therefore, there may be occasional mistakes despite careful proofreading.  ?   Domenick GongMortenson, Abubakar Crispo, MD 09/14/17 1253

## 2017-09-14 NOTE — ED Triage Notes (Signed)
Chest pain began 2am during night while at work. States had hard time taking deep breath. Denies diaphoresis.

## 2017-09-14 NOTE — ED Triage Notes (Signed)
Patient c/o left sided chest pain and pressure and SOB that started last night. Patient states that he has been up since midnight at his job.

## 2017-09-15 ENCOUNTER — Encounter: Payer: Self-pay | Admitting: Cardiovascular Disease

## 2017-09-15 ENCOUNTER — Ambulatory Visit: Payer: BLUE CROSS/BLUE SHIELD | Admitting: Cardiovascular Disease

## 2017-09-15 VITALS — BP 136/84 | HR 103 | Ht 76.0 in | Wt 301.5 lb

## 2017-09-15 DIAGNOSIS — R0602 Shortness of breath: Secondary | ICD-10-CM

## 2017-09-15 DIAGNOSIS — R079 Chest pain, unspecified: Secondary | ICD-10-CM | POA: Diagnosis not present

## 2017-09-15 NOTE — Patient Instructions (Addendum)
Medication Instructions:  Your physician recommends that you continue on your current medications as directed. Please refer to the Current Medication list given to you today.   Labwork: none  Testing/Procedures: Your physician has requested that you have an echocardiogram before your treadmill test. Echocardiography is a painless test that uses sound waves to create images of your heart. It provides your doctor with information about the size and shape of your heart and how well your heart's chambers and valves are working. This procedure takes approximately one hour. There are no restrictions for this procedure.  Your physician has requested that you have an exercise tolerance test. For further information please visit [email or web address] Please also follow instruction sheet, as given.  Please wear comfortable walking shoes (ie., sneakers) Avoid caffeine and smoking 24 hours before your test.  Please bring your inhaler with you.    Follow-Up: Your physician recommends that you schedule a follow-up appointment as needed   Any Other Special Instructions Will Be Listed Below (If Applicable).     If you need a refill on your cardiac medications before your next appointment, please call your pharmacy.   Exercise Stress Electrocardiogram An exercise stress electrocardiogram is a test to check how blood flows to your heart. It is done to find areas of poor blood flow. You will need to walk on a treadmill for this test. The electrocardiogram will record your heartbeat when you are at rest and when you are exercising. What happens before the procedure?  Do not have drinks with caffeine or foods with caffeine for 24 hours before the test, or as told by your doctor. This includes coffee, tea (even decaf tea), sodas, chocolate, and cocoa.  Follow your doctor's instructions about eating and drinking before the test.  Ask your doctor what medicines you should or should not take before the  test. Take your medicines with water unless told by your doctor not to.  If you use an inhaler, bring it with you to the test.  Bring a snack to eat after the test.  Do not  smoke for 4 hours before the test.  Do not put lotions, powders, creams, or oils on your chest before the test.  Wear comfortable shoes and clothing. What happens during the procedure?  You will have patches put on your chest. Small areas of your chest may need to be shaved. Wires will be connected to the patches.  Your heart rate will be watched while you are resting and while you are exercising.  You will walk on the treadmill. The treadmill will slowly get faster to raise your heart rate.  The test will take about 1-2 hours. What happens after the procedure?  Your heart rate and blood pressure will be watched after the test.  You may return to your normal diet, activities, and medicines or as told by your doctor. This information is not intended to replace advice given to you by your health care provider. Make sure you discuss any questions you have with your health care provider. Document Released: 01/28/2008 Document Revised: 04/09/2016 Document Reviewed: 04/18/2013 Elsevier Interactive Patient Education  2018 [employer]. Echocardiogram An echocardiogram, or echocardiography, uses sound waves (ultrasound) to produce an image of your heart. The echocardiogram is simple, painless, obtained within a short period of time, and offers valuable information to your health care provider. The images from an echocardiogram can provide information such as:  Evidence of coronary artery disease (CAD).  Heart size.  Heart muscle function.  Heart valve function.  Aneurysm detection.  Evidence of a past heart attack.  Fluid buildup around the heart.  Heart muscle thickening.  Assess heart valve function.  Tell a health care provider about:  Any allergies you have.  All medicines you are taking,  including vitamins, herbs, eye drops, creams, and over-the-counter medicines.  Any problems you or family members have had with anesthetic medicines.  Any blood disorders you have.  Any surgeries you have had.  Any medical conditions you have.  Whether you are pregnant or may be pregnant. What happens before the procedure? No special preparation is needed. Eat and drink normally. What happens during the procedure?  In order to produce an image of your heart, gel will be applied to your chest and a wand-like tool (transducer) will be moved over your chest. The gel will help transmit the sound waves from the transducer. The sound waves will harmlessly bounce off your heart to allow the heart images to be captured in real-time motion. These images will then be recorded.  You may need an IV to receive a medicine that improves the quality of the pictures. What happens after the procedure? You may return to your normal schedule including diet, activities, and medicines, unless your health care provider tells you otherwise. This information is not intended to replace advice given to you by your health care provider. Make sure you discuss any questions you have with your health care provider. Document Released: 08/08/2000 Document Revised: 03/29/2016 Document Reviewed: 04/18/2013 Elsevier Interactive Patient Education  2017 [employer]

## 2017-09-15 NOTE — Progress Notes (Signed)
Cardiology Office Note   Date:  09/15/2017   ID:  Jon Hardy, DOB 10-23-1980, MRN [id number]  PCP:  Patient, No Pcp Per  Cardiologist:   Lorine Bears, MD   Chief Complaint  Patient presents with  . OTHER    CP, sob and dizziness. Meds reviewed verbally with pt.      History of Present Illness: Jon Hardy is a 37 y.o. male who was referred by Dr. Shaune Pollack at [facility] ED for evaluation of chest pain. He went to urgent care yesterday morning with chest pain and was referred to the emergency room.  He has history of hypertension and hyperlipidemia.  He had 2 urgent care visits in December for upper respiratory tract infection.  He was given a course of antibiotics and ultimately steroids.  However, he continued to have persistent cough and symptoms suggestive of sinusitis. Yesterday, he had left-sided chest pain described as soreness radiating to his left shoulder and neck.  This was associated with dizziness and nausea.  The pain was worse with deep breathing.  Workup in the ED was overall unremarkable. CTA of the chest showed no evidence of pulmonary embolism.  There was questionable enlargement of cardiac chambers.  His labs were unremarkable with 2 negative troponins. Patient is not a smoker and has no family history of premature coronary artery disease.  He has been obese all his life.  No recent change in weight.  Past Medical History:  Diagnosis Date  . Asthma     Past Surgical History:  Procedure Laterality Date  . NO PAST SURGERIES       Current Outpatient Medications  Medication Sig Dispense Refill  . albuterol (PROVENTIL HFA;VENTOLIN HFA) 108 (90 Base) MCG/ACT inhaler Inhale 1-2 puffs into the lungs every 6 (six) hours as needed for wheezing or shortness of breath. Use with spacer 1 Inhaler 0  . fluticasone (FLONASE) 50 MCG/ACT nasal spray Place 2 sprays into both nostrils daily. 16 g 0   No current facility-administered medications for this visit.     Allergies:    Patient has no active allergies.    Social History:  The patient  reports that  has never smoked. he has never used smokeless tobacco. He reports that he does not drink alcohol or use drugs.   Family History:  The patient's family history includes AAA (abdominal aortic aneurysm) in his paternal aunt; Hypertension in his father and mother.    ROS:  Please see the history of present illness.   Otherwise, review of systems are positive for none.   All other systems are reviewed and negative.    PHYSICAL EXAM: VS:  BP 136/84 (BP Location: Right Arm, Patient Position: Sitting, Cuff Size: Large)   Pulse (!) 103   Ht 6\' 4"  (1.93 m)   Wt (!) 301 lb 8 oz (136.8 kg)   BMI 36.70 kg/m  , BMI Body mass index is 36.7 kg/m. GEN: Well nourished, well developed, in no acute distress  HEENT: normal  Neck: no JVD, carotid bruits, or masses Cardiac: RRR; no murmurs, rubs, or gallops,no edema  Respiratory:  clear to auscultation bilaterally, normal work of breathing GI: soft, nontender, nondistended, + BS MS: no deformity or atrophy  Skin: warm and dry, no rash Neuro:  Strength and sensation are intact Psych: euthymic mood, full affect   EKG:  EKG is ordered today. The ekg ordered today demonstrates sinus tachycardia with no significant ST or T wave changes.   Recent Labs: 09/14/2017:  ALT 59; BUN 15; Creatinine, Ser 1.24; Hemoglobin 16.9; Platelets 182; Potassium 3.8; Sodium 139    Lipid Panel No results found for: CHOL, TRIG, HDL, CHOLHDL, VLDL, LDLCALC, LDLDIRECT    Wt Readings from Last 3 Encounters:  09/15/17 (!) 301 lb 8 oz (136.8 kg)  09/14/17 290 lb (131.5 kg)  09/14/17 290 lb (131.5 kg)       PAD Screen 09/15/2017  Previous PAD dx? No  Previous surgical procedure? No  Pain with walking? No  Feet/toe relief with dangling? No  Painful, non-healing ulcers? No  Extremities discolored? No      ASSESSMENT AND PLAN:  1.  Atypical chest pain: With a pleuritic component could  be due to recent recurrent bronchitis.  EKG with no significant ischemic changes but he is mildly tachycardic.  I requested a GXT.  2.  Shortness of breath: Likely due to recent pulmonary infection and an element of physical deconditioning related to obesity.  Some of his chest pain was pleuritic and thus I requested an echocardiogram.  3.  Persistent symptoms of upper respiratory tract infection: I advised him to establish with a primary care physician and he was provided with contact information.  4.  Obesity: I discussed with him the importance of weight loss.    Disposition:   FU with me as needed.   Signed,  Lorine BearsMuhammad Arida, MD  09/15/2017 2:31 PM    [facility]

## 2017-09-17 ENCOUNTER — Telehealth: Payer: Self-pay | Admitting: *Deleted

## 2017-09-17 NOTE — Telephone Encounter (Signed)
Spoke to patient as a friendly reminder of GXT tomorrow and to wear comfortable clothing and shoes, bring inhalers and no caffeine for 24 hours. He was appreciative and verbalized understanding.

## 2017-09-18 ENCOUNTER — Ambulatory Visit (INDEPENDENT_AMBULATORY_CARE_PROVIDER_SITE_OTHER): Payer: BLUE CROSS/BLUE SHIELD

## 2017-09-18 ENCOUNTER — Other Ambulatory Visit: Payer: Self-pay

## 2017-09-18 DIAGNOSIS — R0602 Shortness of breath: Secondary | ICD-10-CM

## 2017-09-18 DIAGNOSIS — R079 Chest pain, unspecified: Secondary | ICD-10-CM | POA: Diagnosis not present

## 2017-09-18 LAB — EXERCISE TOLERANCE TEST
CHL CUP MPHR: 182 {beats}/min
CHL CUP RESTING HR STRESS: 86 {beats}/min
Estimated workload: 10.4 METS
Exercise duration (min): 8 min
Exercise duration (sec): 56 s
Peak HR: 181 {beats}/min
Percent HR: 99 %

## 2017-09-20 ENCOUNTER — Ambulatory Visit
Admission: EM | Admit: 2017-09-20 | Discharge: 2017-09-20 | Disposition: A | Discharge status: Home / Self Care | Payer: BLUE CROSS/BLUE SHIELD | Attending: Physician Assistant | Admitting: Physician Assistant

## 2017-09-20 ENCOUNTER — Other Ambulatory Visit: Payer: Self-pay

## 2017-09-20 DIAGNOSIS — J40 Bronchitis, not specified as acute or chronic: Secondary | ICD-10-CM | POA: Diagnosis not present

## 2017-09-20 DIAGNOSIS — J019 Acute sinusitis, unspecified: Secondary | ICD-10-CM

## 2017-09-20 DIAGNOSIS — J329 Chronic sinusitis, unspecified: Secondary | ICD-10-CM

## 2017-09-20 MED ORDER — CETIRIZINE HCL 10 MG PO TABS: 10.0000 mg | ORAL_TABLET | Freq: Every day | ORAL | 0 refills | Status: DC

## 2017-09-20 MED ORDER — AMOXICILLIN-POT CLAVULANATE 875-125 MG PO TABS: 1.0000 | ORAL_TABLET | Freq: Two times a day (BID) | ORAL | 0 refills | Status: DC

## 2017-09-20 MED ORDER — GUAIFENESIN-CODEINE 100-10 MG/5ML PO SYRP: 5.0000 mL | ORAL_SOLUTION | Freq: Three times a day (TID) | ORAL | 0 refills | Status: DC | PRN

## 2017-09-20 NOTE — ED Triage Notes (Signed)
Pt reports he has been seen for similar multiple times since November. Has been seen here 3 x and also went to ER and f/u with cardiology last week for chest pain. "I'm just not getting better". Reports yellow nasal drainage and coughing up yellow. Leaving to go out of town tonight and wants to get something before he leaves. No pain today.

## 2017-09-20 NOTE — ED Provider Notes (Signed)
[facility]    CSN: [id number] Arrival date & time: 09/20/17  1348     History   Chief Complaint Chief Complaint  Patient presents with  . Cough    HPI Jon Hardy is a 37 y.o. male.   Patient is a 37 year old male who presents with complaint of congestion, cough, and drainage.  Patient has been seen at this clinic x3, ER x1, and with cardiology this past week with similar related symptoms.  The first 2 visits here, patient was treated with Flonase, Tessalon and the second visit albuterol was added.  Patient was seen last Monday, 1/21, with complaint of some shortness of breath and chest discomfort.  Those he thought were associated with the headache continued symptoms but given his history, there was concern for acute coronary issues so patient was transferred to the ER.  Workup in the ER showed normal troponins.  A chest x-ray was concerning for some possible interstitial edema.  A CT angiogram of the chest showed no PEs.  Patient was seen by cardiologist week in regards to his chest discomfort.  He had a stress test done which appears normal.  Echocardiogram showed some mild LVH but otherwise normal function.  Cardiology notes that believes that the patient's discomfort issues have been related to a upper respiratory and pulmonary infection/bronchitis.  Patient reports he will feel okay in the morning but As it goes later in the day and at night he will have worsening congestion and cough, especially at night when laying down.  He states he will use a Nettie pot in the morning with return of yellowish/orange mucus.  Patient is also using Flonase twice daily.  Patient reports she is going out of town for 3 weeks for work training and is wanting to get something to help finally get over this.      Past Medical History:  Diagnosis Date  . Asthma     There are no active problems to display for this patient.   Past Surgical History:  Procedure Laterality Date  . NO  PAST SURGERIES         Home Medications    Prior to Admission medications   Medication Sig Start Date End Date Taking? Authorizing Provider  albuterol (PROVENTIL HFA;VENTOLIN HFA) 108 (90 Base) MCG/ACT inhaler Inhale 1-2 puffs into the lungs every 6 (six) hours as needed for wheezing or shortness of breath. Use with spacer 08/15/17   Lutricia Feiloemer, William P, PA-C  amoxicillin-clavulanate (AUGMENTIN) 875-125 MG tablet Take 1 tablet by mouth every 12 (twelve) hours. 09/20/17   Candis SchatzHarris, Billy Rocco D, PA-C  cetirizine (ZYRTEC) 10 MG tablet Take 1 tablet (10 mg total) by mouth daily. 09/20/17   Candis SchatzHarris, Sonu Kruckenberg D, PA-C  fluticasone (FLONASE) 50 MCG/ACT nasal spray Place 2 sprays into both nostrils daily. 08/15/17   Lutricia Feiloemer, William P, PA-C    Family History Family History  Problem Relation Age of Onset  . Hypertension Mother   . Hypertension Father   . AAA (abdominal aortic aneurysm) Paternal Aunt     Social History Social History   Tobacco Use  . Smoking status: Never Smoker  . Smokeless tobacco: Never Used  Substance Use Topics  . Alcohol use: No    Frequency: Never  . Drug use: No     Allergies   Patient has no known allergies.   Review of Systems Review of Systems  As noted above in HPI.  Other systems reviewed and found to be negative   Physical  Exam Triage Vital Signs ED Triage Vitals  Enc Vitals Group     BP 09/20/17 1406 (!) 148/88     Pulse Rate 09/20/17 1406 98     Resp 09/20/17 1406 20     Temp 09/20/17 1406 98.4 F (36.9 C)     Temp Source 09/20/17 1406 Oral     SpO2 09/20/17 1406 100 %     Weight 09/20/17 1409 (!) 301 lb (136.5 kg)     Height 09/20/17 1409 6' (1.829 m)     Head Circumference --      Peak Flow --      Pain Score 09/20/17 1408 0     Pain Loc --      Pain Edu? --      Excl. in GC? --    No data found.  Updated Vital Signs BP (!) 148/88 (BP Location: Right Arm)   Pulse 98   Temp 98.4 F (36.9 C) (Oral)   Resp 20   Ht 6' (1.829 m)   Wt  (!) 301 lb (136.5 kg)   SpO2 100%   BMI 40.82 kg/m   Physical Exam  Constitutional: He is oriented to person, place, and time. He appears well-developed and well-nourished. No distress.  HENT:  Right Ear: Tympanic membrane and ear canal normal.  Left Ear: Tympanic membrane and ear canal normal.  Nose: Right sinus exhibits maxillary sinus tenderness. Left sinus exhibits maxillary sinus tenderness.  Mouth/Throat: Uvula is midline.  Some postnasal drainage.  Eyes: EOM are normal. Pupils are equal, round, and reactive to light.  Neck: Normal range of motion. Neck supple.  Cardiovascular: Normal rate, regular rhythm and normal heart sounds. Exam reveals no friction rub.  No murmur heard. Pulmonary/Chest: Effort normal and breath sounds normal. No respiratory distress.  Abdominal: Soft. Bowel sounds are normal.  Musculoskeletal: Normal range of motion.  Lymphadenopathy:    He has no cervical adenopathy.  Neurological: He is alert and oriented to person, place, and time. No cranial nerve deficit.  Skin: Skin is warm and dry.     UC Treatments / Results  Labs (all labs ordered are listed, but only abnormal results are displayed) Labs Reviewed - No data to display  EKG  EKG Interpretation None       Radiology No results found.  Procedures Procedures (including critical care time)  Medications Ordered in UC Medications - No data to display   Initial Impression / Assessment and Plan / UC Course  I have reviewed the triage vital signs and the nursing notes.  Pertinent labs & imaging results that were available during my care of the patient were reviewed by me and considered in my medical decision making (see chart for details).    Patient reported yellowish drainage with Nettie pot daily despite Flonase use.  Patient signs symptoms also consistent with continued upper respiratory issues.  We will go ahead and give her a prescription for Augmentin.  We will also add Zyrtec  to his twice daily Flonase. Final Clinical Impressions(s) / UC Diagnoses   Final diagnoses:  Bronchitis  Sinusitis, unspecified chronicity, unspecified location    ED Discharge Orders        Ordered    amoxicillin-clavulanate (AUGMENTIN) 875-125 MG tablet  Every 12 hours     09/20/17 1449    cetirizine (ZYRTEC) 10 MG tablet  Daily     09/20/17 1449       Controlled Substance Prescriptions  Controlled Substance Registry consulted? Yes, I  have consulted the [place] Controlled Substances Registry for this patient, and feel the risk/benefit ratio today is favorable for proceeding with this prescription for a controlled substance.   Candis Schatz, PA-C 09/20/17 1455

## 2017-09-20 NOTE — Discharge Instructions (Signed)
-  Augmentin: 1 tablet twice daily -Zyrtec 1 tablet daily -Cheratussin: 5 mL 3 times a day as needed for cough -will continue the Nettie pot Flonase

## 2017-09-23 ENCOUNTER — Telehealth: Payer: Self-pay

## 2017-09-23 NOTE — Telephone Encounter (Signed)
Called to follow up with patient since visit here at [facility]. Patient instructed to call back with any questions or concerns. MAH  

## 2018-06-24 ENCOUNTER — Other Ambulatory Visit: Payer: Self-pay

## 2018-06-24 ENCOUNTER — Ambulatory Visit
Admission: EM | Admit: 2018-06-24 | Discharge: 2018-06-24 | Disposition: A | Discharge status: Home / Self Care | Payer: BLUE CROSS/BLUE SHIELD | Attending: Family Medicine | Admitting: Family Medicine

## 2018-06-24 DIAGNOSIS — J209 Acute bronchitis, unspecified: Secondary | ICD-10-CM

## 2018-06-24 MED ORDER — DOXYCYCLINE HYCLATE 100 MG PO CAPS: 100.0000 mg | ORAL_CAPSULE | Freq: Two times a day (BID) | ORAL | 0 refills | Status: DC

## 2018-06-24 MED ORDER — ALBUTEROL SULFATE HFA 108 (90 BASE) MCG/ACT IN AERS: 1.0000 | INHALATION_SPRAY | Freq: Four times a day (QID) | RESPIRATORY_TRACT | 0 refills | Status: AC | PRN

## 2018-06-24 MED ORDER — PREDNISONE 50 MG PO TABS: ORAL_TABLET | ORAL | 0 refills | Status: DC

## 2018-06-24 NOTE — ED Provider Notes (Signed)
[facility]    CSN: [id number] Arrival date & time: 06/24/18  1025  History   Chief Complaint Chief Complaint  Patient presents with  . Cough   HPI  37 year old male presents with cough.  Patient has been sick for the past 8 days.  He reports drainage, cough, congestion.  Cough is productive of thick brown/green mucus.  Reports associated shortness of breath.  No fever.  No chills.  No relief with treatments at home.  Symptoms are severe.  No known exacerbating factors.  No other complaints.  PMH, Surgical Hx, Family Hx, Social History reviewed and updated as below.  Past Medical History:  Diagnosis Date  . Asthma    Patient Active Problem List   Diagnosis Date Noted  . Bronchitis 09/20/2017   Past Surgical History:  Procedure Laterality Date  . NO PAST SURGERIES     Home Medications    Prior to Admission medications   Medication Sig Start Date End Date Taking? Authorizing Provider  albuterol (PROVENTIL HFA;VENTOLIN HFA) 108 (90 Base) MCG/ACT inhaler Inhale 1-2 puffs into the lungs every 6 (six) hours as needed for wheezing or shortness of breath. 06/24/18   Tommie Sams, DO  doxycycline (VIBRAMYCIN) 100 MG capsule Take 1 capsule (100 mg total) by mouth 2 (two) times daily. 06/24/18   Tommie Sams, DO  predniSONE (DELTASONE) 50 MG tablet 1 tablet daily x 5 days 06/24/18   Tommie Sams, DO    Family History Family History  Problem Relation Age of Onset  . Hypertension Mother   . Hypertension Father   . AAA (abdominal aortic aneurysm) Paternal Aunt     Social History Social History   Tobacco Use  . Smoking status: Never Smoker  . Smokeless tobacco: Never Used  Substance Use Topics  . Alcohol use: No    Frequency: Never  . Drug use: No     Allergies   Patient has no known allergies.   Review of Systems Review of Systems  Constitutional: Negative for fever.  HENT: Positive for congestion and postnasal drip.   Respiratory: Positive for  cough and shortness of breath.    Physical Exam Triage Vital Signs ED Triage Vitals  Enc Vitals Group     BP 06/24/18 1040 (!) 132/94     Pulse Rate 06/24/18 1040 70     Resp 06/24/18 1040 18     Temp 06/24/18 1040 98 F (36.7 C)     Temp Source 06/24/18 1040 Oral     SpO2 06/24/18 1040 99 %     Weight 06/24/18 1037 285 lb (129.3 kg)     Height 06/24/18 1037 6' (1.829 m)     Head Circumference --      Peak Flow --      Pain Score 06/24/18 1037 0     Pain Loc --      Pain Edu? --      Excl. in GC? --    Updated Vital Signs BP (!) 132/94 (BP Location: Left Arm)   Pulse 70   Temp 98 F (36.7 C) (Oral)   Resp 18   Ht 6' (1.829 m)   Wt 129.3 kg   SpO2 99%   BMI 38.65 kg/m   Visual Acuity Right Eye Distance:   Left Eye Distance:   Bilateral Distance:    Right Eye Near:   Left Eye Near:    Bilateral Near:     Physical Exam  Constitutional: He is  oriented to person, place, and time. He appears well-developed. No distress.  HENT:  Head: Normocephalic and atraumatic.  Mouth/Throat: Oropharynx is clear and moist.  Cardiovascular: Normal rate and regular rhythm.  Pulmonary/Chest: Effort normal and breath sounds normal. He has no wheezes. He has no rales.  Neurological: He is alert and oriented to person, place, and time.  Psychiatric: He has a normal mood and affect. His behavior is normal.  Nursing note and vitals reviewed.  UC Treatments / Results  Labs (all labs ordered are listed, but only abnormal results are displayed) Labs Reviewed - No data to display  EKG None  Radiology No results found.  Procedures Procedures (including critical care time)  Medications Ordered in UC Medications - No data to display  Initial Impression / Assessment and Plan / UC Course  I have reviewed the triage vital signs and the nursing notes.  Pertinent labs & imaging results that were available during my care of the patient were reviewed by me and considered in my medical  decision making (see chart for details).    37 year old male presents with acute bronchitis.  Treated with doxycycline, prednisone, albuterol.  Final Clinical Impressions(s) / UC Diagnoses   Final diagnoses:  Acute bronchitis, unspecified organism   Discharge Instructions   None    ED Prescriptions    Medication Sig Dispense Auth. Provider   doxycycline (VIBRAMYCIN) 100 MG capsule Take 1 capsule (100 mg total) by mouth 2 (two) times daily. 14 capsule Gibson Lad G, DO   predniSONE (DELTASONE) 50 MG tablet 1 tablet daily x 5 days 5 tablet Ruble Buttler G, DO   albuterol (PROVENTIL HFA;VENTOLIN HFA) 108 (90 Base) MCG/ACT inhaler Inhale 1-2 puffs into the lungs every 6 (six) hours as needed for wheezing or shortness of breath. 1 Inhaler Tommie Sams, DO     Controlled Substance Prescriptions [place] Controlled Substance Registry consulted? Not Applicable   Tommie Sams, DO 06/24/18 1118

## 2018-06-24 NOTE — ED Triage Notes (Signed)
Patient complains of cough, congestion, coughing up thick brown/green mucus x 8 days.

## 2018-07-28 ENCOUNTER — Ambulatory Visit (INDEPENDENT_AMBULATORY_CARE_PROVIDER_SITE_OTHER): Payer: BLUE CROSS/BLUE SHIELD

## 2018-07-28 ENCOUNTER — Ambulatory Visit
Admission: EM | Admit: 2018-07-28 | Discharge: 2018-07-28 | Disposition: A | Discharge status: Home / Self Care | Payer: BLUE CROSS/BLUE SHIELD | Attending: Family Medicine | Admitting: Family Medicine

## 2018-07-28 ENCOUNTER — Other Ambulatory Visit: Payer: Self-pay

## 2018-07-28 ENCOUNTER — Encounter: Payer: Self-pay | Admitting: Emergency Medicine

## 2018-07-28 DIAGNOSIS — R079 Chest pain, unspecified: Secondary | ICD-10-CM

## 2018-07-28 DIAGNOSIS — J209 Acute bronchitis, unspecified: Secondary | ICD-10-CM | POA: Insufficient documentation

## 2018-07-28 LAB — CBC WITH DIFFERENTIAL/PLATELET
Abs Immature Granulocytes: 0.01 10*3/uL (ref 0.00–0.07)
BASOS ABS: 0.1 10*3/uL (ref 0.0–0.1)
Basophils Relative: 1 %
Eosinophils Absolute: 0.3 10*3/uL (ref 0.0–0.5)
Eosinophils Relative: 4 %
HCT: 45.4 % (ref 39.0–52.0)
Hemoglobin: 16 g/dL (ref 13.0–17.0)
Immature Granulocytes: 0 %
LYMPHS ABS: 1.9 10*3/uL (ref 0.7–4.0)
Lymphocytes Relative: 24 %
MCH: 30 pg (ref 26.0–34.0)
MCHC: 35.2 g/dL (ref 30.0–36.0)
MCV: 85.2 fL (ref 80.0–100.0)
Monocytes Absolute: 0.6 10*3/uL (ref 0.1–1.0)
Monocytes Relative: 8 %
NRBC: 0 % (ref 0.0–0.2)
Neutro Abs: 5 10*3/uL (ref 1.7–7.7)
Neutrophils Relative %: 63 %
Platelets: 184 10*3/uL (ref 150–400)
RBC: 5.33 MIL/uL (ref 4.22–5.81)
RDW: 13.4 % (ref 11.5–15.5)
WBC: 7.9 10*3/uL (ref 4.0–10.5)

## 2018-07-28 LAB — BASIC METABOLIC PANEL
Anion gap: 10 (ref 5–15)
BUN: 13 mg/dL (ref 6–20)
CALCIUM: 9.1 mg/dL (ref 8.9–10.3)
CO2: 23 mmol/L (ref 22–32)
Chloride: 107 mmol/L (ref 98–111)
Creatinine, Ser: 1.18 mg/dL (ref 0.61–1.24)
GFR calc Af Amer: >60 mL/min (ref >60)
GFR calc non Af Amer: >60 mL/min (ref >60)
Glucose, Bld: 98 mg/dL (ref 70–99)
Potassium: 3.5 mmol/L (ref 3.5–5.1)
Sodium: 140 mmol/L (ref 135–145)

## 2018-07-28 LAB — TROPONIN I: Troponin I: <0.03 ng/mL (ref <0.03)

## 2018-07-28 NOTE — ED Provider Notes (Signed)
[facility] ____________________________________________  Time seen: Approximately 4:45 PM  I have reviewed the triage vital signs and the nursing notes.   HISTORY  Chief Complaint Chest Pain   HPI Jon Hardy is a 37 y.o. male past medical history of obesity, asthma, recurrent bronchitis presenting for evaluation of chest pain.  Patient reports that he has a history of some similar chest pain.  Patient describes chest discomfort as an "abnormal feeling "to the left side of his chest that is somewhat uncomfortable.  Also states he has had some increase of belching and "gassy "feeling since last night as well.  States that he works third shift and noticed this after eating around 2 AM.  States since that time the discomfort has been intermittent without specific trigger or length of time lasting.  Does report that he did additional walking and exercise at work which helped alleviate the discomfort.  Has not taken any over-the-counter medications for the same.  No accompanying shortness of breath, fevers, vomiting, pain radiation, paresthesias, neck pain, back pain or abdominal pain.  States that he slept during the day today, and when he awoke he felt the discomfort again prompting him to come in.  Patient with personal history of obesity all his life.  Denies smoking or drug use.  No personal history of hypertension, diabetes, smoking.  States the last month he has been sick with coughing and congestion complaints.  States cough has much improved and only rare now.  Does report he still gets thick greenish nasal drainage out first thing in the morning but denies any other drainage.  No sinus pressure.  States that he finally feels like he is getting over his recent sickness.  Of note patient has history of recurrent asthmatic bronchitis, in which referring to cardiology (Dr. Kirke Corin) notes, suspect this is related to his chest pain complaints.  Patient had echocardiogram, stress test  in January 2019 that per cardiology were unremarkable.     Past Medical History:  Diagnosis Date  . Asthma     Patient Active Problem List   Diagnosis Date Noted  . Bronchitis 09/20/2017    Past Surgical History:  Procedure Laterality Date  . NO PAST SURGERIES       No current facility-administered medications for this encounter.   Current Outpatient Medications:  .  albuterol (PROVENTIL HFA;VENTOLIN HFA) 108 (90 Base) MCG/ACT inhaler, Inhale 1-2 puffs into the lungs every 6 (six) hours as needed for wheezing or shortness of breath., Disp: 1 Inhaler, Rfl: 0  Allergies Patient has no known allergies.  Family History  Problem Relation Age of Onset  . Hypertension Mother   . Hypertension Father   . AAA (abdominal aortic aneurysm) Paternal Aunt   Father: a.fib.   Social History Social History   Tobacco Use  . Smoking status: Never Smoker  . Smokeless tobacco: Never Used  Substance Use Topics  . Alcohol use: No    Frequency: Never  . Drug use: No    Review of Systems Constitutional: No fever ENT: No sore throat. Cardiovascular: positive chest pain. Respiratory: Denies shortness of breath. Gastrointestinal: No abdominal pain.  Some nausea. no vomiting.  No diarrhea.  No constipation. Genitourinary: Negative for dysuria. Musculoskeletal: Negative for back pain. Skin: Negative for rash. Neurological: Negative for headaches, focal weakness or numbness.   ____________________________________________   PHYSICAL EXAM:  VITAL SIGNS: ED Triage Vitals  Enc Vitals Group     BP 07/28/18 1636 (!) 144/93  Pulse Rate 07/28/18 1636 72     Resp 07/28/18 1636 18     Temp 07/28/18 1636 97.8 F (36.6 C)     Temp Source 07/28/18 1636 Oral     SpO2 07/28/18 1636 100 %     Weight 07/28/18 1634 290 lb (131.5 kg)     Height 07/28/18 1634 6' (1.829 m)     Head Circumference --      Peak Flow --      Pain Score 07/28/18 1633 1     Pain Loc --      Pain Edu? --       Excl. in [place]? --     Constitutional: Alert and oriented. Well appearing and in no acute distress. Eyes: Conjunctivae are normal.  Head: Atraumatic.  No sinus tenderness palpation.  No swelling. No erythema.   Ears: no erythema, normal TMs bilaterally.   Nose: Minimal nasal congestion.  Mouth/Throat: Mucous membranes are moist.  Oropharynx non-erythematous.No tonsillar swelling or exudate.  Neck: No stridor.  No cervical spine tenderness to palpation. Hematological/Lymphatic/Immunilogical: No cervical lymphadenopathy. Cardiovascular: Normal rate, regular rhythm. Grossly normal heart sounds.  Good peripheral circulation. Respiratory: Normal respiratory effort.  No retractions. No wheezes, rales or rhonchi. Good air movement.  Gastrointestinal: Soft and nontender.Normal Bowel sounds.  Musculoskeletal: Steady gait.  Chest nontender to palpation.  Bilateral lower extremities no edema noted and nontender. Neurologic:  Normal speech and language. No gross focal neurologic deficits are appreciated. No gait instability. Skin:  Skin is warm, dry and intact. No rash noted. Psychiatric: Mood and affect are normal. Speech and behavior are normal.  ___________________________________________   LABS (all labs ordered are listed, but only abnormal results are displayed)  Labs Reviewed  TROPONIN I  CBC WITH DIFFERENTIAL/PLATELET  BASIC METABOLIC PANEL   ____________________________________________  EKG  ED ECG REPORT I, Renford DillsLindsey Levorn Oleski, the attending provider and Dr Judd Gaudieronty, personally viewed and interpreted this ECG.   Date: 07/28/2018  EKG Time: 1636  Rate: 67  Rhythm: normal sinus rhythm  Axis: normal  Intervals:none  ST&T Change: none Similar to previous EKG on 09/15/2017. RADIOLOGY  Dg Chest 2 View  Result Date: 07/28/2018 CLINICAL DATA:  Intermittent chest pressure. EXAM: CHEST - 2 VIEW COMPARISON:  Chest CT 09/14/2017 FINDINGS: Cardiomediastinal silhouette is normal. Mediastinal  contours appear intact. There is no evidence of focal airspace consolidation, pleural effusion or pneumothorax. Bilateral bronchiectasis with mild peribronchial thickening. Osseous structures are without acute abnormality. Soft tissues are grossly normal. IMPRESSION: Mild bronchitic changes. No evidence of lobar consolidation or pulmonary edema. Electronically Signed   By: Ted Mcalpineobrinka  Dimitrova M.D.   On: 07/28/2018 17:49   ____________________________________________ Radiology EXAM: CHEST - 2 VIEW  COMPARISON:  Chest CT 09/14/2017  FINDINGS: Cardiomediastinal silhouette is normal. Mediastinal contours appear intact.  There is no evidence of focal airspace consolidation, pleural effusion or pneumothorax. Bilateral bronchiectasis with mild peribronchial thickening.  Osseous structures are without acute abnormality. Soft tissues are grossly normal.  IMPRESSION: Mild bronchitic changes.  No evidence of lobar consolidation or pulmonary edema.   Electronically Signed   By: Ted Mcalpineobrinka  Dimitrova M.D.   On: 07/28/2018 17:49  PROCEDURES Procedures     INITIAL IMPRESSION / ASSESSMENT AND PLAN / ED COURSE  Pertinent labs & imaging results that were available during my care of the patient were reviewed by me and considered in my medical decision making (see chart for details).  Patient has been seen in ER as well as urgent care for similar  complaints in the last year.  Respiratory and bronchitic complaints with 4 visits in outpatient setting as well as 2 episodes of chest pain.  Patient was then evaluated by cardiology with subsequent negative echocardiogram and stress test in January of this year.  Patient does report he has had a recent sickness but feeling much better.  Presenting for left chest discomfort that is mild since 2 AM.  Also reports exertion helped discomfort.  At this time in discussion with patient suspect secondary to recent pulmonary sickness as with similar  previous evaluations.  Called and discussed and reviewed patient with Dr.McLean cardiologist on-call, who also agrees with initial impression.  Dr. Shirlee Latch recommends 1 troponin evaluation, as well as will evaluate chest x-ray.  Discussed this with patient and wife at bedside who agree with plan.  Patient reevaluated, and reports that he is currently pain-free.  Labs reviewed and discussed with patient, unremarkable.  Chest x-ray as above per radiologist, mild bronchitic changes, no consolidation or pulmonary edema.  Discussed these findings with patient.  Lungs clear throughout this time on exam.  Has albuterol inhaler at home, recommend to use for the next few days and continue to monitor.  Over-the-counter Tylenol and ibuprofen as needed.  Will defer prednisone use at this time his lungs clear.  Again reiterated importance to follow-up closely with cardiology, recommend to have follow-up tomorrow.  Proceed directly to the emergency room for any worsening complaints.  Encourage supportive care.  Recommend follow-up with cardiologist tomorrow.  Call first thing in the morning.  Encouraged establishing close follow-up with primary care.  Discussed follow up and return parameters including no resolution or any worsening concerns. Patient verbalized understanding and agreed to plan.   ____________________________________________   FINAL CLINICAL IMPRESSION(S) / ED DIAGNOSES  Final diagnoses:  Chest pain, unspecified type  Acute bronchitis, unspecified organism     ED Discharge Orders    None       Note: This dictation was prepared with Dragon dictation along with smaller phrase technology. Any transcriptional errors that result from this process are unintentional.         Renford Dills, NP 07/28/18 1831

## 2018-07-28 NOTE — Discharge Instructions (Addendum)
Use albuterol inhaler as discussed.  Closely monitor.  Rest.  Encouraged to have close follow-up with cardiology.  Call their office tomorrow to be seen.  Follow up with your primary care physician this week as needed. Return to Urgent care as needed.  For any worsening pain, shortness of breath or worsening complaints go directly to the emergency room.

## 2018-07-28 NOTE — ED Triage Notes (Signed)
Patient c/o intermittent chest pressure that started around 2 am this morning. He does state he has had some SOB and nausea.

## 2018-10-11 ENCOUNTER — Other Ambulatory Visit: Payer: Self-pay

## 2018-10-11 ENCOUNTER — Ambulatory Visit
Admission: EM | Admit: 2018-10-11 | Discharge: 2018-10-11 | Disposition: A | Discharge status: Home / Self Care | Payer: BLUE CROSS/BLUE SHIELD | Attending: Family Medicine | Admitting: Family Medicine

## 2018-10-11 ENCOUNTER — Encounter: Payer: Self-pay | Admitting: Emergency Medicine

## 2018-10-11 DIAGNOSIS — R030 Elevated blood-pressure reading, without diagnosis of hypertension: Secondary | ICD-10-CM

## 2018-10-11 DIAGNOSIS — J029 Acute pharyngitis, unspecified: Secondary | ICD-10-CM | POA: Diagnosis not present

## 2018-10-11 LAB — RAPID STREP SCREEN (MED CTR MEBANE ONLY): Streptococcus, Group A Screen (Direct): NEGATIVE

## 2018-10-11 NOTE — ED Triage Notes (Signed)
Patient c/o sore throat and nasal congestion that started yesterday. Denies fever. Patient has been exposed to strep last week.

## 2018-10-11 NOTE — ED Provider Notes (Signed)
[facility] ____________________________________________  Time seen: Approximately 3:46 PM  I have reviewed the triage vital signs and the nursing notes.   HISTORY  Chief Complaint Sore Throat (APPT)  HPI Jon Hardy is a 38 y.o. male presenting for evaluation of sore throat since last night.  States sore throat is currently mild.  In the last little while has started to notice some postnasal nasal congestion as well.  Denies fevers.  Overall continues to eat and drink well.  States that his wife and kids had strep last week and wanted to be evaluated to make sure no strep throat.  Overall doing well otherwise.  Denies recent sickness.  Denies chest pain or shortness of breath.  States that he did notice some rash to his bilateral forearms earlier, but states that it since resolved and no alleviating measures to do so and denies trigger.  No PCP   Past Medical History:  Diagnosis Date  . Asthma     Patient Active Problem List   Diagnosis Date Noted  . Bronchitis 09/20/2017    Past Surgical History:  Procedure Laterality Date  . NO PAST SURGERIES       No current facility-administered medications for this encounter.   Current Outpatient Medications:  .  albuterol (PROVENTIL HFA;VENTOLIN HFA) 108 (90 Base) MCG/ACT inhaler, Inhale 1-2 puffs into the lungs every 6 (six) hours as needed for wheezing or shortness of breath., Disp: 1 Inhaler, Rfl: 0  Allergies Patient has no known allergies.  Family History  Problem Relation Age of Onset  . Hypertension Mother   . Hypertension Father   . AAA (abdominal aortic aneurysm) Paternal Aunt     Social History Social History   Tobacco Use  . Smoking status: Never Smoker  . Smokeless tobacco: Never Used  Substance Use Topics  . Alcohol use: No    Frequency: Never  . Drug use: No    Review of Systems Constitutional: No fever Cardiovascular: Denies chest pain. Respiratory: Denies shortness of  breath. Gastrointestinal: No abdominal pain.   Musculoskeletal: Negative for back pain. Skin: Negative for rash.   ____________________________________________   PHYSICAL EXAM:  VITAL SIGNS: ED Triage Vitals  Enc Vitals Group     BP 10/11/18 1331 (!) 154/107     Pulse Rate 10/11/18 1331 73     Resp 10/11/18 1331 18     Temp 10/11/18 1331 98.2 F (36.8 C)     Temp Source 10/11/18 1331 Oral     SpO2 10/11/18 1331 100 %     Weight 10/11/18 1329 287 lb (130.2 kg)     Height 10/11/18 1329 6' (1.829 m)     Head Circumference --      Peak Flow --      Pain Score 10/11/18 1329 6     Pain Loc --      Pain Edu? --      Excl. in [place]? --     Constitutional: Alert and oriented. Well appearing and in no acute distress. Eyes: Conjunctivae are normal.  Head: Atraumatic. No sinus tenderness to palpation. No swelling. No erythema.  Ears: no erythema, normal TMs bilaterally.   Nose:No nasal congestion.  Mouth/Throat: Mucous membranes are moist. Mild pharyngeal erythema. No tonsillar swelling or exudate.  Neck: No stridor.  No cervical spine tenderness to palpation. Hematological/Lymphatic/Immunilogical: No cervical lymphadenopathy. Cardiovascular: Normal rate, regular rhythm. Grossly normal heart sounds.  Good peripheral circulation. Respiratory: Normal respiratory effort.  No retractions. No wheezes, rales or rhonchi.  Good air movement.  Musculoskeletal: Ambulatory with steady gait. Neurologic:  Normal speech and language. No gait instability. Skin:  Skin appears warm, dry and intact. No rash noted. Psychiatric: Mood and affect are normal. Speech and behavior are normal.  ___________________________________________   LABS (all labs ordered are listed, but only abnormal results are displayed)  Labs Reviewed  RAPID STREP SCREEN ([facility] ONLY)  CULTURE, GROUP A STREP [facility])     PROCEDURES Procedures   INITIAL IMPRESSION / ASSESSMENT AND PLAN / ED COURSE  Pertinent labs  & imaging results that were available during my care of the patient were reviewed by me and considered in my medical decision making (see chart for details).  Well-appearing patient.  No acute distress.  Sore throat since last night.  Strep negative, will culture.  Positive exposures, however exam overall well-appearing, will await strep culture.  Encourage rest, fluids, supportive care, also encouraged to establish primary for regular blood pressure monitoring.  Work note given.  Discussed follow up with Primary care physician this week. Discussed follow up and return parameters including no resolution or any worsening concerns. Patient verbalized understanding and agreed to plan.   ____________________________________________   FINAL CLINICAL IMPRESSION(S) / ED DIAGNOSES  Final diagnoses:  Pharyngitis, unspecified etiology     ED Discharge Orders    None       Note: This dictation was prepared with Dragon dictation along with smaller phrase technology. Any transcriptional errors that result from this process are unintentional.         Renford Dills, NP 10/11/18 1604

## 2018-10-11 NOTE — Discharge Instructions (Addendum)
Rest. Drink plenty of fluids.  ° °Follow up with your primary care physician this week as needed. Return to Urgent care for new or worsening concerns.  ° °

## 2018-10-14 LAB — CULTURE, GROUP A STREP (THRC)

## 2019-09-13 ENCOUNTER — Other Ambulatory Visit: Payer: Self-pay

## 2019-09-13 ENCOUNTER — Ambulatory Visit
Admission: EM | Admit: 2019-09-13 | Discharge: 2019-09-13 | Disposition: A | Discharge status: Home / Self Care | Payer: BC Managed Care – PPO | Attending: Family Medicine | Admitting: Family Medicine

## 2019-09-13 ENCOUNTER — Encounter: Payer: Self-pay | Admitting: Emergency Medicine

## 2019-09-13 DIAGNOSIS — R42 Dizziness and giddiness: Secondary | ICD-10-CM

## 2019-09-13 DIAGNOSIS — R110 Nausea: Secondary | ICD-10-CM | POA: Diagnosis present

## 2019-09-13 DIAGNOSIS — Z20822 Contact with and (suspected) exposure to covid-19: Secondary | ICD-10-CM

## 2019-09-13 MED ORDER — PANTOPRAZOLE SODIUM 40 MG PO TBEC: 40.0000 mg | DELAYED_RELEASE_TABLET | Freq: Every day | ORAL | 1 refills | Status: AC

## 2019-09-13 MED ORDER — ONDANSETRON HCL 4 MG PO TABS: 4.0000 mg | ORAL_TABLET | Freq: Three times a day (TID) | ORAL | 0 refills | Status: DC | PRN

## 2019-09-13 NOTE — ED Triage Notes (Signed)
Patient c/o nausea and  "not feeling right" x 2 weeks. He reports on Thursday he had watery diarrhea but this has resolved. Patient c/o dizziness x 1 Saturday and x 1 Sunday. He states this morning he became lightheaded, teeth started chattering and his arms feel heavy.

## 2019-09-13 NOTE — Discharge Instructions (Signed)
Healthy diet.  Medications as directed.  Get yourself a primary care doctor - [facility], [facility], [facility], [facility].  Results available in 24 to 48 hours.  Take care  Dr. Adriana Simas

## 2019-09-13 NOTE — ED Provider Notes (Signed)
[facility]    CSN: [id number] Arrival date & time: 09/13/19  0841  History   Chief Complaint Chief Complaint  Patient presents with  . Nausea  . Dizziness   HPI  39 year old male presents with multiple complaints.  Patient reports that he has not been feeling well for the past 1.5 to 2 weeks.  He reports intermittent nausea.  He is also had some episodes of belching.  He is also had an approximate 3-day history of chest pain.  He states that his chest pain has now resolved.  He states that the pain moves to different places.  Patient also reports intermittent dizziness/lightheadedness.  He has also had diarrhea.  He states that he felt poorly this morning after he felt lightheaded and felt like he was shivering.  He states that his arms feel heavy and tingling/numb.  No documented fever.  No known inciting factor.  No relieving factors.  No reported sick contacts.  Patient states that he contacted his workplace and was told to come in for evaluation.  Requesting Covid testing today.  No other complaints.  PMH, Surgical Hx, Family Hx, Social History reviewed and updated as below.  Past Medical History:  Diagnosis Date  . Asthma    Patient Active Problem List   Diagnosis Date Noted  . Bronchitis 09/20/2017   Past Surgical History:  Procedure Laterality Date  . NO PAST SURGERIES     Home Medications    Prior to Admission medications   Medication Sig Start Date End Date Taking? Authorizing Provider  albuterol (PROVENTIL HFA;VENTOLIN HFA) 108 (90 Base) MCG/ACT inhaler Inhale 1-2 puffs into the lungs every 6 (six) hours as needed for wheezing or shortness of breath. 06/24/18  Yes Macsen Nuttall G, DO  ondansetron (ZOFRAN) 4 MG tablet Take 1 tablet (4 mg total) by mouth every 8 (eight) hours as needed for nausea or vomiting. 09/13/19   Tommie Sams, DO  pantoprazole (PROTONIX) 40 MG tablet Take 1 tablet (40 mg total) by mouth daily. 09/13/19   Tommie Sams, DO    Family  History Family History  Problem Relation Age of Onset  . Hypertension Mother   . Hypertension Father   . AAA (abdominal aortic aneurysm) Paternal Aunt     Social History Social History   Tobacco Use  . Smoking status: Never Smoker  . Smokeless tobacco: Never Used  Substance Use Topics  . Alcohol use: No  . Drug use: No     Allergies   Patient has no known allergies.   Review of Systems Review of Systems  Constitutional: Positive for chills. Negative for fever.  Cardiovascular: Positive for chest pain.  Gastrointestinal: Positive for diarrhea and nausea.  Neurological: Positive for numbness.   Physical Exam Triage Vital Signs ED Triage Vitals  Enc Vitals Group     BP 09/13/19 0912 (!) 150/100     Pulse Rate 09/13/19 0912 100     Resp 09/13/19 0912 18     Temp 09/13/19 0912 98.1 F (36.7 C)     Temp Source 09/13/19 0912 Oral     SpO2 09/13/19 0912 100 %     Weight 09/13/19 0910 280 lb (127 kg)     Height 09/13/19 0910 6\' 1"  (1.854 m)     Head Circumference --      Peak Flow --      Pain Score 09/13/19 0909 0     Pain Loc --      Pain  Edu? --      Excl. in [place]? --    Updated Vital Signs BP (!) 150/100 (BP Location: Right Arm)   Pulse 100   Temp 98.1 F (36.7 C) (Oral)   Resp 18   Ht 6\' 1"  (1.854 m)   Wt 127 kg   SpO2 100%   BMI 36.94 kg/m   Visual Acuity Right Eye Distance:   Left Eye Distance:   Bilateral Distance:    Right Eye Near:   Left Eye Near:    Bilateral Near:     Physical Exam Vitals and nursing note reviewed.  Constitutional:      General: He is not in acute distress.    Appearance: Normal appearance. He is obese. He is not ill-appearing.  HENT:     Head: Normocephalic and atraumatic.  Eyes:     General:        Right eye: No discharge.     Conjunctiva/sclera: Conjunctivae normal.  Cardiovascular:     Rate and Rhythm: Regular rhythm. Tachycardia present.     Heart sounds: No murmur.  Pulmonary:     Effort: Pulmonary effort  is normal.     Breath sounds: Normal breath sounds. No wheezing, rhonchi or rales.  Abdominal:     General: There is no distension.     Palpations: Abdomen is soft.     Tenderness: There is no abdominal tenderness. There is no guarding or rebound.  Neurological:     Mental Status: He is alert.  Psychiatric:        Mood and Affect: Mood normal.        Behavior: Behavior normal.    UC Treatments / Results  Labs (all labs ordered are listed, but only abnormal results are displayed) Labs Reviewed  NOVEL CORONAVIRUS, NAA (HOSP ORDER, SEND-OUT TO REF LAB; TAT 18-24 HRS)    EKG   Radiology No results found.  Procedures Procedures (including critical care time)  Medications Ordered in UC Medications - No data to display  Initial Impression / Assessment and Plan / UC Course  I have reviewed the triage vital signs and the nursing notes.  Pertinent labs & imaging results that were available during my care of the patient were reviewed by me and considered in my medical decision making (see chart for details).    39 year old male presents with multiple complaints.  Nausea -suspect related to GERD/reflux.  Zofran as needed.  Also placing on Protonix.  Lightheadedness -etiology uncertain at this time.  Patient has numerous complaints.  I suspect that anxiety is playing a role.  Covid testing -given patient's complaints, Covid testing obtained.  Awaiting test result.  Work note given.  Advised to get himself a primary care physician.  List of local offices given.  Final Clinical Impressions(s) / UC Diagnoses   Final diagnoses:  Nausea  Lightheadedness  Encounter for laboratory testing for COVID-19 virus     Discharge Instructions     Healthy diet.  Medications as directed.  Get yourself a primary care doctor - [facility], [place], [facility], [facility].  Results available in 24 to 48 hours.  Take care  Dr. Lacinda Axon    ED Prescriptions     Medication Sig Dispense Auth. Provider   ondansetron (ZOFRAN) 4 MG tablet Take 1 tablet (4 mg total) by mouth every 8 (eight) hours as needed for nausea or vomiting. 20 tablet Prabhjot Maddux G, DO   pantoprazole (PROTONIX) 40 MG tablet Take 1  tablet (40 mg total) by mouth daily. 30 tablet Tommie Sams, DO     PDMP not reviewed this encounter.   Everlene Other [place], [place] 09/13/19 [id number]

## 2019-09-14 ENCOUNTER — Other Ambulatory Visit: Payer: Self-pay

## 2019-09-14 ENCOUNTER — Encounter: Payer: Self-pay | Admitting: Emergency Medicine

## 2019-09-14 ENCOUNTER — Emergency Department
Admission: EM | Admit: 2019-09-14 | Discharge: 2019-09-14 | Disposition: A | Discharge status: Home / Self Care | Payer: BC Managed Care – PPO | Attending: Student | Admitting: Student

## 2019-09-14 DIAGNOSIS — R9431 Abnormal electrocardiogram [ECG] [EKG]: Secondary | ICD-10-CM | POA: Diagnosis not present

## 2019-09-14 DIAGNOSIS — J45909 Unspecified asthma, uncomplicated: Secondary | ICD-10-CM | POA: Insufficient documentation

## 2019-09-14 DIAGNOSIS — R531 Weakness: Secondary | ICD-10-CM | POA: Diagnosis not present

## 2019-09-14 DIAGNOSIS — R110 Nausea: Secondary | ICD-10-CM | POA: Insufficient documentation

## 2019-09-14 DIAGNOSIS — R202 Paresthesia of skin: Secondary | ICD-10-CM | POA: Diagnosis not present

## 2019-09-14 DIAGNOSIS — R42 Dizziness and giddiness: Secondary | ICD-10-CM | POA: Diagnosis not present

## 2019-09-14 LAB — TROPONIN I (HIGH SENSITIVITY): Troponin I (High Sensitivity): 4 ng/L (ref <18)

## 2019-09-14 LAB — URINALYSIS, COMPLETE (UACMP) WITH MICROSCOPIC
Bacteria, UA: NONE SEEN
Bilirubin Urine: NEGATIVE
Glucose, UA: NEGATIVE mg/dL
Hgb urine dipstick: NEGATIVE
Ketones, ur: NEGATIVE mg/dL
Leukocytes,Ua: NEGATIVE
Nitrite: NEGATIVE
Protein, ur: NEGATIVE mg/dL
Specific Gravity, Urine: 1.02 (ref 1.005–1.030)
Squamous Epithelial / HPF: NONE SEEN (ref 0–5)
pH: 5 (ref 5.0–8.0)

## 2019-09-14 LAB — CBC
HCT: 49.2 % (ref 39.0–52.0)
Hemoglobin: 17.3 g/dL — ABNORMAL HIGH (ref 13.0–17.0)
MCH: 29.9 pg (ref 26.0–34.0)
MCHC: 35.2 g/dL (ref 30.0–36.0)
MCV: 85.1 fL (ref 80.0–100.0)
Platelets: 172 10*3/uL (ref 150–400)
RBC: 5.78 MIL/uL (ref 4.22–5.81)
RDW: 13.4 % (ref 11.5–15.5)
WBC: 9 10*3/uL (ref 4.0–10.5)
nRBC: 0 % (ref 0.0–0.2)

## 2019-09-14 LAB — BASIC METABOLIC PANEL
Anion gap: 8 (ref 5–15)
BUN: 16 mg/dL (ref 6–20)
CO2: 27 mmol/L (ref 22–32)
Calcium: 9.4 mg/dL (ref 8.9–10.3)
Chloride: 104 mmol/L (ref 98–111)
Creatinine, Ser: 1.49 mg/dL — ABNORMAL HIGH (ref 0.61–1.24)
GFR calc Af Amer: >60 mL/min (ref >60)
GFR calc non Af Amer: 58 mL/min — ABNORMAL LOW (ref >60)
Glucose, Bld: 110 mg/dL — ABNORMAL HIGH (ref 70–99)
Potassium: 4.1 mmol/L (ref 3.5–5.1)
Sodium: 139 mmol/L (ref 135–145)

## 2019-09-14 LAB — NOVEL CORONAVIRUS, NAA (HOSP ORDER, SEND-OUT TO REF LAB; TAT 18-24 HRS): SARS-CoV-2, NAA: NOT DETECTED

## 2019-09-14 LAB — GLUCOSE, CAPILLARY: Glucose-Capillary: 97 mg/dL (ref 70–99)

## 2019-09-14 NOTE — ED Provider Notes (Addendum)
[facility] Emergency Department Provider Note  ____________________________________________   First MD Initiated Contact with Patient 09/14/19 2102     (approximate)  I have reviewed the triage vital signs and the nursing notes.  History  Chief Complaint Dizziness and Nausea    HPI Jon Hardy is a 38 y.o. male who presents to the emergency department for intermittent episodes of feeling unwell.  For the last 1 to 2 weeks he has had several of these episodes.  They primarily consist of nausea, lightheadedness, and numbness/tingling sensation in his back and upper extremities.  Today's episode also included some vague discomfort in his epigastric area, 6/10, no radiation, no alleviating/aggravating factors.    Episodes typically last several minutes before spontaneously resolving.  He denies any associated chest pain or palpitations. No loss of consciousness, dizziness, or syncope.  No facial droop, speech difficulties, lateralizing weakness.  No history of arrhythmias. Had some loose stool earlier in the week.  Was seen and evaluated at urgent care yesterday for similar symptoms, COVID testing done was negative.  Started on Zofran and pantoprazole.   Past Medical Hx Past Medical History:  Diagnosis Date  . Asthma     Problem List Patient Active Problem List   Diagnosis Date Noted  . Bronchitis 09/20/2017    Past Surgical Hx Past Surgical History:  Procedure Laterality Date  . NO PAST SURGERIES      Medications Prior to Admission medications   Medication Sig Start Date End Date Taking? Authorizing Provider  albuterol (PROVENTIL HFA;VENTOLIN HFA) 108 (90 Base) MCG/ACT inhaler Inhale 1-2 puffs into the lungs every 6 (six) hours as needed for wheezing or shortness of breath. 06/24/18   Coral Spikes, DO  ondansetron (ZOFRAN) 4 MG tablet Take 1 tablet (4 mg total) by mouth every 8 (eight) hours as needed for nausea or vomiting. 09/13/19   Coral Spikes, DO  pantoprazole (PROTONIX) 40 MG tablet Take 1 tablet (40 mg total) by mouth daily. 09/13/19   Coral Spikes, DO    Allergies Patient has no known allergies.  Family Hx Family History  Problem Relation Age of Onset  . Hypertension Mother   . Hypertension Father   . AAA (abdominal aortic aneurysm) Paternal Aunt     Social Hx Social History   Tobacco Use  . Smoking status: Never Smoker  . Smokeless tobacco: Never Used  Substance Use Topics  . Alcohol use: No  . Drug use: No     Review of Systems  Constitutional: Negative for fever, chills. + lightheadedness Eyes: Negative for visual changes. ENT: Negative for sore throat. Cardiovascular: Negative for chest pain. Respiratory: Negative for shortness of breath. Gastrointestinal: + for nausea.  Genitourinary: Negative for dysuria. Musculoskeletal: Negative for leg swelling. Skin: Negative for rash. Neurological: Negative for for headaches. +tingling   Physical Exam  Vital Signs: ED Triage Vitals  Enc Vitals Group     BP 09/14/19 1803 (!) 159/104     Pulse Rate 09/14/19 1803 71     Resp 09/14/19 1803 18     Temp 09/14/19 1803 98.7 F (37.1 C)     Temp Source 09/14/19 1803 Oral     SpO2 09/14/19 1803 100 %     Weight 09/14/19 1805 279 lb 15.8 oz (127 kg)     Height 09/14/19 1805 6\' 1"  (1.854 m)     Head Circumference --      Peak Flow --      Pain  Score 09/14/19 1803 6     Pain Loc --      Pain Edu? --      Excl. in GC? --     Constitutional: Alert and oriented.  Head: Normocephalic. Atraumatic. Eyes: Conjunctivae clear. Sclera anicteric. Nose: No congestion. No rhinorrhea. Mouth/Throat: Wearing mask.  Neck: No stridor.   Cardiovascular: Normal rate, regular rhythm. Extremities well perfused. Respiratory: Normal respiratory effort.  Lungs CTAB. Gastrointestinal: Soft. Non-tender. Non-distended.  Musculoskeletal: No lower extremity edema. No deformities. Neurologic:  Normal speech and language.  No gross focal neurologic deficits are appreciated. Alert and oriented.  Face symmetric.  Tongue midline.  Cranial nerves II through XII intact. UE and LE strength 5/5 and symmetric. UE and LE SILT.  Skin: Skin is warm, dry and intact. No rash noted. Psychiatric: Mood and affect are appropriate for situation.  EKG  Personally reviewed.   Rate: 72 Rhythm: sinus Axis: normal Intervals: shortened PR, 108 ms, question delta wave No acute ischemic changes No evidence of Brugada or prolonged QTc No STEMI    Procedures  Procedure(s) performed (including critical care):  Procedures   Initial Impression / Assessment and Plan / ED Course  39 y.o. male who presents to the ED multiple complaints, as above.  Primarily intermittent episodes of nausea, lightheadedness, numbness and tingling.  Ddx: anemia, electrolyte abnormality, anxiety, intermittent arrhythmia.  Doubt aortic pathology, no severe hypertension, equal and symmetric distal pulses, symptoms are intermittent and atypical in description.  Work-up reveals severely mildly elevated creatinine compared to prior.  Otherwise electrolytes without actionable derangements.  No anemia.  No urine infection.  Negative troponin.  EKG reveals shortened PR, question delta wave, but not definitive WPW. Advised cardiology follow up for potential Holter monitoring, further evaluation.    Otherwise, given negative work-up, stable for discharge.  Recommend PCP follow-up and cardiology follow-up as above, given return precautions.  Patient agreeable with plan.   Final Clinical Impression(s) / ED Diagnosis  Final diagnoses:  Nausea  Tingling  Lightheadedness  Weakness  Shortened PR interval       Note:  This document was prepared using Dragon voice recognition software and may include unintentional dictation errors.     Miguel Aschoff., MD 09/14/19 2220

## 2019-09-14 NOTE — ED Notes (Signed)
Pt c/o nausea and dizziness with intermittent abd pain x 2wks. Pt states he just started feeling funny with pins and needles feeling on his right side, with numbness and tingling bilateral upper extremities. Pt placed on CCM at this time with call bell within reach.

## 2019-09-14 NOTE — Discharge Instructions (Addendum)
Thank you for letting us take care of you in the emergency department today.   Please continue to take any regular, prescribed medications.  Stay well-hydrated and continue to eat a healthy diet and get regular exercise.  Please follow up with: - A primary care doctor to review your ER visit and follow up on your symptoms.  - A cardiology doctor, information for 2 are listed below.  Call to schedule an appointment.  They may consider putting you on a Holter monitor.  Please return to the ER for any new or worsening symptoms.

## 2019-09-14 NOTE — ED Triage Notes (Signed)
Patient reports nausea, dizziness and weakness intermittently for the last 2 weeks. Reports he was seen at [facility]. States when he gets dizzy, he also tends to have "pins and needles everywhere". Patient also reports intermittent chest pain. Denies fever

## 2019-09-16 ENCOUNTER — Telehealth: Payer: Self-pay

## 2019-09-16 NOTE — Telephone Encounter (Signed)
Left voicemail message to call office and schedule cardiology follow up post ER visit.

## 2019-09-20 ENCOUNTER — Telehealth: Payer: Self-pay

## 2019-09-22 NOTE — Telephone Encounter (Signed)
Have attempted to call patient three times to schedule hospital follow up. Letter mailed to patient.

## 2019-09-23 ENCOUNTER — Ambulatory Visit
Admission: EM | Admit: 2019-09-23 | Discharge: 2019-09-23 | Disposition: A | Discharge status: Home / Self Care | Payer: BC Managed Care – PPO | Attending: Family Medicine | Admitting: Family Medicine

## 2019-09-23 ENCOUNTER — Other Ambulatory Visit: Payer: Self-pay

## 2019-09-23 ENCOUNTER — Encounter: Payer: Self-pay | Admitting: Emergency Medicine

## 2019-09-23 DIAGNOSIS — R42 Dizziness and giddiness: Secondary | ICD-10-CM

## 2019-09-23 NOTE — ED Triage Notes (Addendum)
Patient c/o dizziness off and on for a week.  Patient was seen here on 09/12/18 and then was sent to [facility] ED for further evaluation.  Patient states that he followed up with the Cardiologist and has a holter monitor in place yesterday.  Patient also states that he has an appointment with his PCP on 09/25/18 this Monday.  Patient reports nausea.  Patient denies V/D.   Patient denies any pain.

## 2019-09-23 NOTE — Discharge Instructions (Signed)
Monitor blood sugars Small frequent meals instead of big meals Follow up on Holter Monitor Follow up with Primary Care Provider

## 2019-09-25 NOTE — ED Provider Notes (Signed)
[facility]    CSN: [id number] Arrival date & time: 09/23/19  1513      History   Chief Complaint Chief Complaint  Patient presents with  . Dizziness    HPI Jon Hardy is a 39 y.o. male.   39 yo male with a c/o dizziness on and off for weeks. Patient has been seen here and at [facility] ED for this in the past several weeks and has had labs/ekg done.  Currently has a Holter monitor. Denies any vision changes, nausea/vomiting, fevers, chills, pain, unilateral weakness, numbness. States dizziness feels like "woozy" or "lightheaded" and denies vertigo. Also states he's recently been under a lot of stress and thinks he may have had a "panic attack" recently.    Dizziness   Past Medical History:  Diagnosis Date  . Asthma     Patient Active Problem List   Diagnosis Date Noted  . Bronchitis 09/20/2017    Past Surgical History:  Procedure Laterality Date  . NO PAST SURGERIES         Home Medications    Prior to Admission medications   Medication Sig Start Date End Date Taking? Authorizing Provider  albuterol (PROVENTIL HFA;VENTOLIN HFA) 108 (90 Base) MCG/ACT inhaler Inhale 1-2 puffs into the lungs every 6 (six) hours as needed for wheezing or shortness of breath. 06/24/18   Tommie Sams, DO  ondansetron (ZOFRAN) 4 MG tablet Take 1 tablet (4 mg total) by mouth every 8 (eight) hours as needed for nausea or vomiting. 09/13/19   Tommie Sams, DO  pantoprazole (PROTONIX) 40 MG tablet Take 1 tablet (40 mg total) by mouth daily. 09/13/19   Tommie Sams, DO    Family History Family History  Problem Relation Age of Onset  . Hypertension Mother   . Hypertension Father   . AAA (abdominal aortic aneurysm) Paternal Aunt     Social History Social History   Tobacco Use  . Smoking status: Never Smoker  . Smokeless tobacco: Never Used  Substance Use Topics  . Alcohol use: No  . Drug use: No     Allergies   Patient has no known allergies.   Review of Systems  Review of Systems  Neurological: Positive for dizziness.     Physical Exam Triage Vital Signs ED Triage Vitals  Enc Vitals Group     BP 09/23/19 1538 (!) 142/95     Pulse Rate 09/23/19 1538 66     Resp 09/23/19 1538 16     Temp 09/23/19 1538 98.5 F (36.9 C)     Temp Source 09/23/19 1538 Oral     SpO2 09/23/19 1538 100 %     Weight 09/23/19 1535 181 lb (82.1 kg)     Height 09/23/19 1535 6' (1.829 m)     Head Circumference --      Peak Flow --      Pain Score 09/23/19 1535 0     Pain Loc --      Pain Edu? --      Excl. in GC? --    No data found.  Updated Vital Signs BP (!) 142/95 (BP Location: Right Arm)   Pulse 66   Temp 98.5 F (36.9 C) (Oral)   Resp 16   Ht 6' (1.829 m)   Wt 82.1 kg   SpO2 100%   BMI 24.55 kg/m   Visual Acuity Right Eye Distance:   Left Eye Distance:   Bilateral Distance:    Right Eye  Near:   Left Eye Near:    Bilateral Near:     Physical Exam Vitals and nursing note reviewed.  Constitutional:      General: He is not in acute distress.    Appearance: He is not toxic-appearing or diaphoretic.  HENT:     Right Ear: Tympanic membrane normal.     Left Ear: Tympanic membrane normal.  Eyes:     Extraocular Movements: Extraocular movements intact.     Pupils: Pupils are equal, round, and reactive to light.  Cardiovascular:     Rate and Rhythm: Normal rate.     Heart sounds: Normal heart sounds.  Pulmonary:     Effort: Pulmonary effort is normal. No respiratory distress.     Breath sounds: Normal breath sounds.  Neurological:     General: No focal deficit present.     Mental Status: He is alert and oriented to person, place, and time.      UC Treatments / Results  Labs (all labs ordered are listed, but only abnormal results are displayed) Labs Reviewed - No data to display  EKG   Radiology No results found.  Procedures Procedures (including critical care time)  Medications Ordered in UC Medications - No data to  display  Initial Impression / Assessment and Plan / UC Course  I have reviewed the triage vital signs and the nursing notes.  Pertinent labs & imaging results that were available during my care of the patient were reviewed by me and considered in my medical decision making (see chart for details).      Final Clinical Impressions(s) / UC Diagnoses   Final diagnoses:  Lightheadedness     Discharge Instructions     Monitor blood sugars Small frequent meals instead of big meals Follow up on Holter Monitor Follow up with Primary Care Provider    ED Prescriptions    None      1. Recent lab/ekg results and diagnosis reviewed with patient 3. Recommend supportive treatment as above 3. Follow up as scheduled with cardiologist and PCP 4. Follow-up prn    PDMP not reviewed this encounter.   Norval Gable, MD 09/25/19 [phone]

## 2019-09-26 ENCOUNTER — Other Ambulatory Visit: Payer: Self-pay | Admitting: Gerontology

## 2019-09-26 DIAGNOSIS — R42 Dizziness and giddiness: Secondary | ICD-10-CM

## 2019-09-27 ENCOUNTER — Other Ambulatory Visit: Payer: Self-pay | Admitting: Gerontology

## 2019-09-27 DIAGNOSIS — N1831 Chronic kidney disease, stage 3a: Secondary | ICD-10-CM

## 2019-09-27 DIAGNOSIS — R42 Dizziness and giddiness: Secondary | ICD-10-CM

## 2019-09-27 DIAGNOSIS — Z7689 Persons encountering health services in other specified circumstances: Secondary | ICD-10-CM

## 2019-09-27 DIAGNOSIS — I10 Essential (primary) hypertension: Secondary | ICD-10-CM

## 2019-09-27 DIAGNOSIS — Z7189 Other specified counseling: Secondary | ICD-10-CM

## 2019-09-27 DIAGNOSIS — Z0000 Encounter for general adult medical examination without abnormal findings: Secondary | ICD-10-CM

## 2019-10-05 ENCOUNTER — Other Ambulatory Visit: Payer: Self-pay

## 2019-10-05 ENCOUNTER — Ambulatory Visit
Admission: RE | Admit: 2019-10-05 | Discharge: 2019-10-05 | Disposition: A | Discharge status: Home / Self Care | Payer: BC Managed Care – PPO | Source: Ambulatory Visit | Attending: Gerontology | Admitting: Gerontology

## 2019-10-05 ENCOUNTER — Other Ambulatory Visit: Payer: Self-pay | Admitting: Gerontology

## 2019-10-05 ENCOUNTER — Other Ambulatory Visit (HOSPITAL_COMMUNITY): Payer: Self-pay | Admitting: Gerontology

## 2019-10-05 DIAGNOSIS — R1031 Right lower quadrant pain: Secondary | ICD-10-CM | POA: Insufficient documentation

## 2019-10-07 ENCOUNTER — Ambulatory Visit: Payer: BLUE CROSS/BLUE SHIELD

## 2019-10-07 ENCOUNTER — Ambulatory Visit: Payer: BC Managed Care – PPO

## 2019-10-10 ENCOUNTER — Ambulatory Visit
Admission: RE | Admit: 2019-10-10 | Discharge: 2019-10-10 | Disposition: A | Discharge status: Home / Self Care | Payer: BC Managed Care – PPO | Source: Ambulatory Visit | Attending: Gerontology | Admitting: Gerontology

## 2019-10-10 ENCOUNTER — Other Ambulatory Visit: Payer: Self-pay

## 2019-10-10 DIAGNOSIS — Z7189 Other specified counseling: Secondary | ICD-10-CM | POA: Diagnosis not present

## 2019-10-10 DIAGNOSIS — Z0000 Encounter for general adult medical examination without abnormal findings: Secondary | ICD-10-CM | POA: Diagnosis not present

## 2019-10-10 DIAGNOSIS — R42 Dizziness and giddiness: Secondary | ICD-10-CM | POA: Diagnosis not present

## 2019-10-10 DIAGNOSIS — Z7689 Persons encountering health services in other specified circumstances: Secondary | ICD-10-CM | POA: Insufficient documentation

## 2019-10-10 NOTE — Progress Notes (Signed)
*  PRELIMINARY RESULTS* Echocardiogram 2D Echocardiogram has been performed.  Cristela Blue 10/10/2019, 10:19 AM

## 2019-11-01 ENCOUNTER — Other Ambulatory Visit: Payer: Self-pay | Admitting: Family Medicine

## 2019-11-04 ENCOUNTER — Other Ambulatory Visit: Payer: Self-pay | Admitting: Family Medicine

## 2020-05-03 ENCOUNTER — Ambulatory Visit
Admission: EM | Admit: 2020-05-03 | Discharge: 2020-05-03 | Disposition: A | Discharge status: Home / Self Care | Payer: BC Managed Care – PPO | Attending: Physician Assistant | Admitting: Physician Assistant

## 2020-05-03 ENCOUNTER — Other Ambulatory Visit: Payer: Self-pay

## 2020-05-03 DIAGNOSIS — Z20822 Contact with and (suspected) exposure to covid-19: Secondary | ICD-10-CM

## 2020-05-03 LAB — SARS CORONAVIRUS 2 (TAT 6-24 HRS): SARS Coronavirus 2: NEGATIVE

## 2020-05-03 NOTE — ED Triage Notes (Signed)
Patient in today for COVID exposure, no sx. Nurse visit only for COVID testing.   Patient is vaccinated for COVID. Pfizer 2nd dose received 11/2019.

## 2020-05-14 ENCOUNTER — Ambulatory Visit
Admission: EM | Admit: 2020-05-14 | Discharge: 2020-05-14 | Disposition: A | Discharge status: Home / Self Care | Payer: BC Managed Care – PPO | Attending: Family Medicine | Admitting: Family Medicine

## 2020-05-14 DIAGNOSIS — Z20822 Contact with and (suspected) exposure to covid-19: Secondary | ICD-10-CM

## 2020-05-14 NOTE — ED Triage Notes (Signed)
Patient in today for COVID testing, asymptomatic.   Patient's daughter tested positive approx. 2 wks ago.

## 2020-05-15 LAB — SARS CORONAVIRUS 2 (TAT 6-24 HRS): SARS Coronavirus 2: NEGATIVE

## 2020-08-28 IMAGING — CT CT ABD-PELV W/O CM
3 of 4 series · 10 of 46 positions shown, 17 images · non-contrast
Comparison: Overlapping portions of chest CT from 09/14/2017

CLINICAL DATA: Right lower quadrant abdominal pain. Nausea and
diarrhea for 3 weeks.

EXAM:
CT ABDOMEN AND PELVIS WITHOUT CONTRAST
TECHNIQUE: Multidetector CT imaging of the abdomen and pelvis was performed
following the standard protocol without IV contrast.

[Series 4: lung bases · axial · 0.82mm/px · z∈[-172,-47]mm · 6 of 36 slices shown, 11 images]
[im 6/36  soft-tissue]
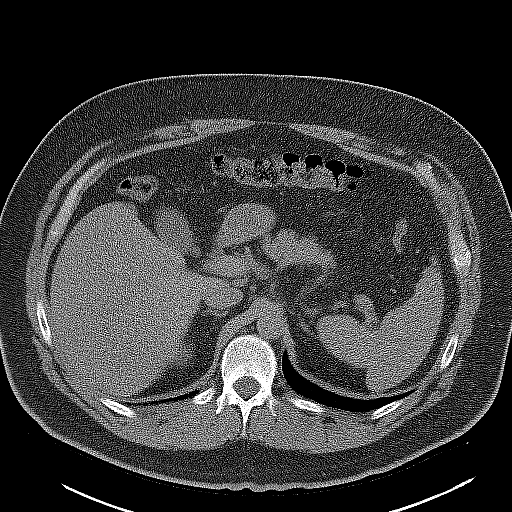
[im 6/36  bone]
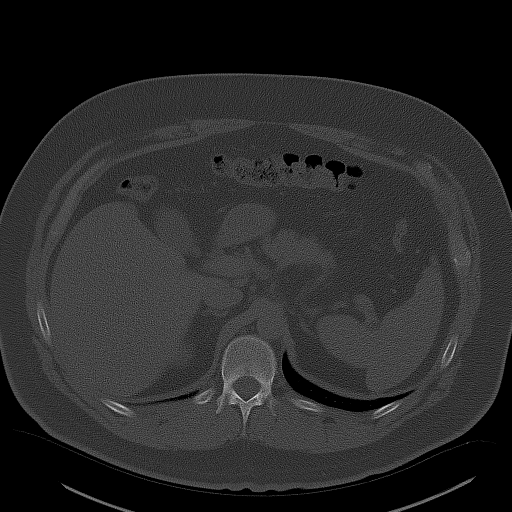
[im 11/36  soft-tissue]
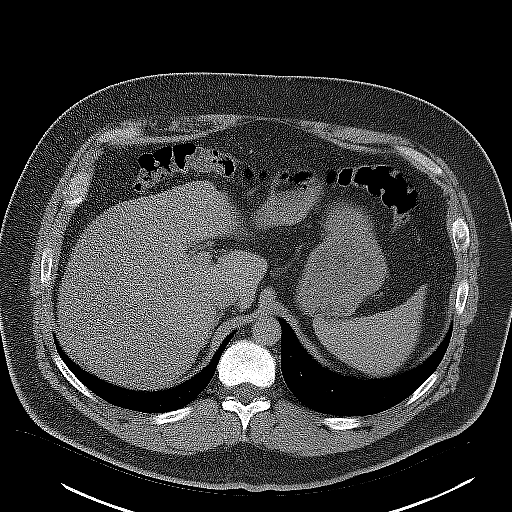
[im 16/36  soft-tissue]
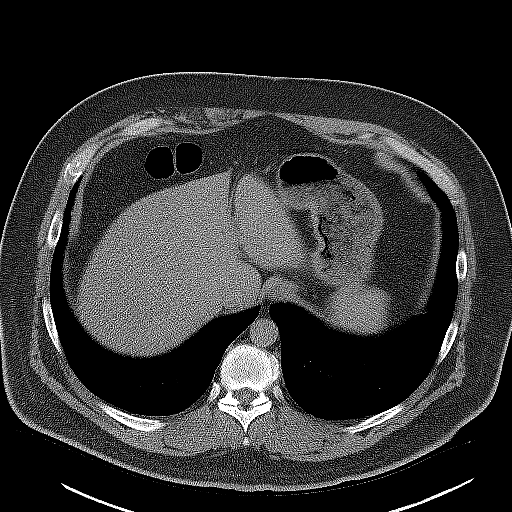
[im 16/36  lung]
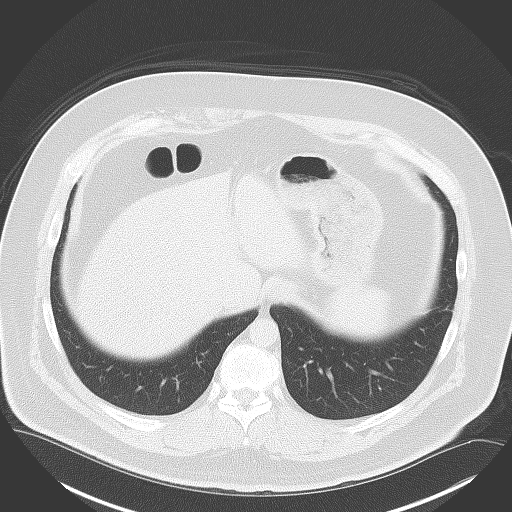
[im 21/36  soft-tissue]
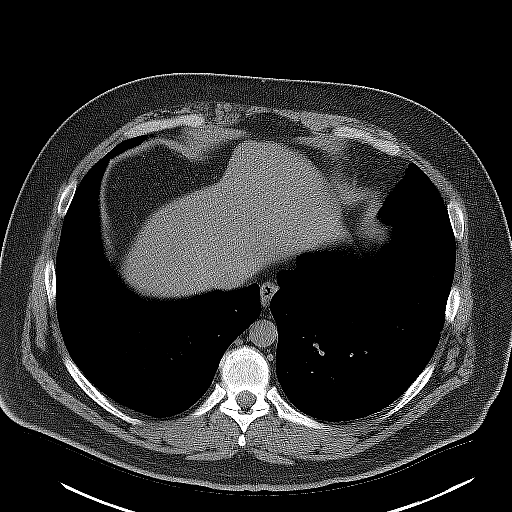
[im 21/36  lung]
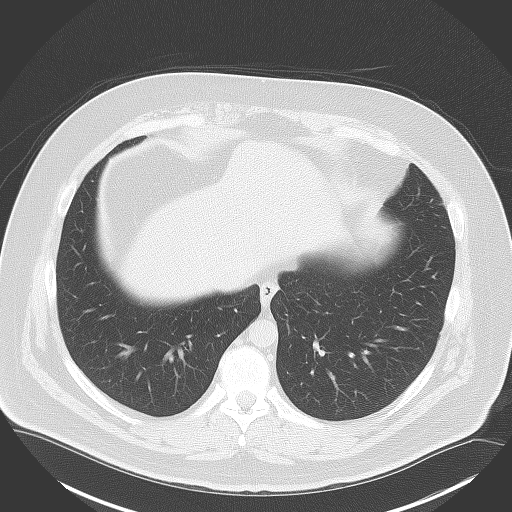
[im 26/36  soft-tissue]
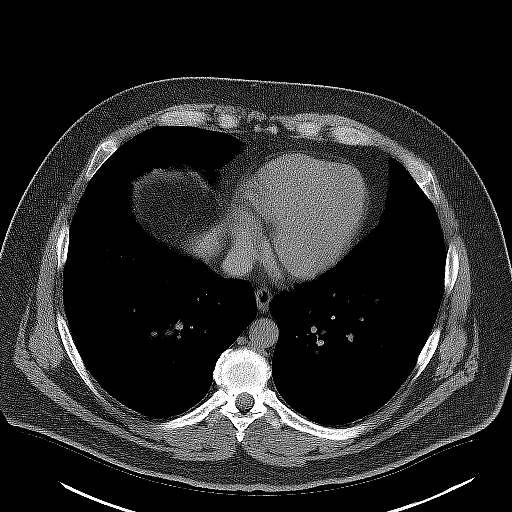
[im 26/36  lung]
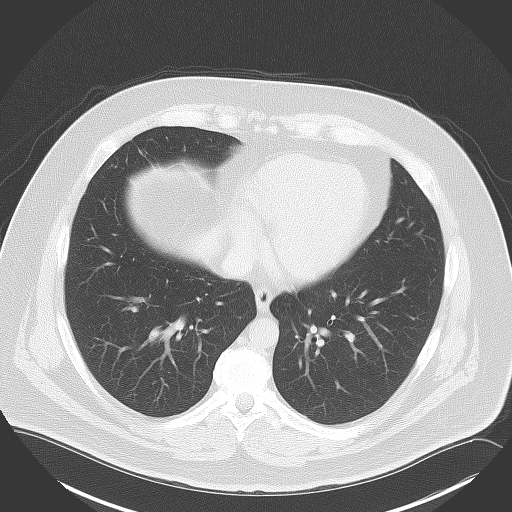
[im 31/36  soft-tissue]
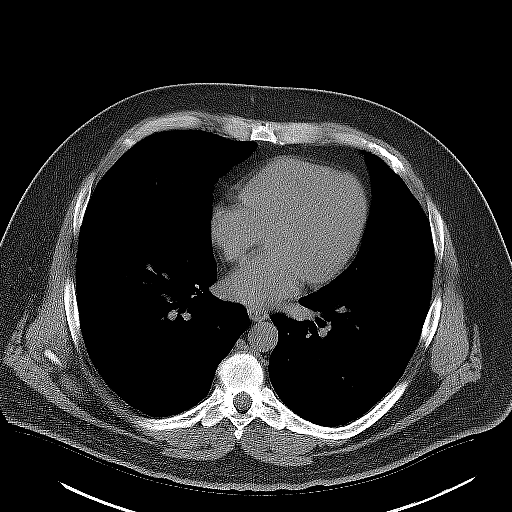
[im 31/36  lung]
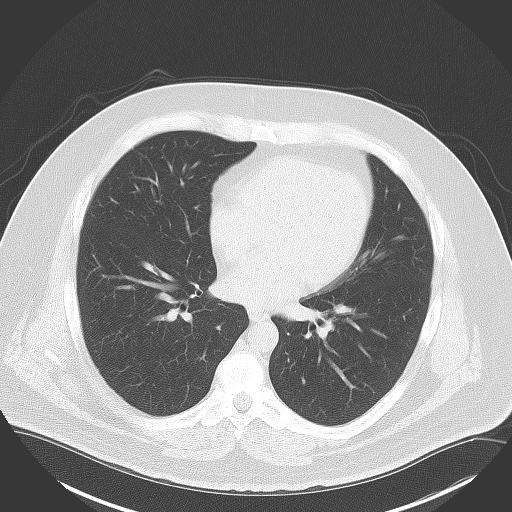

[Series 5: coronal · coronal · 0.91mm/px · 3 of 158 slices shown, 4 images]
[im 53/158  soft-tissue]
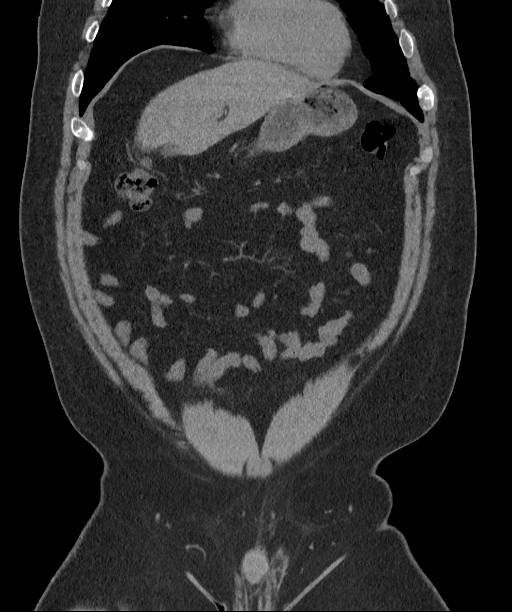
[im 70/158  soft-tissue]
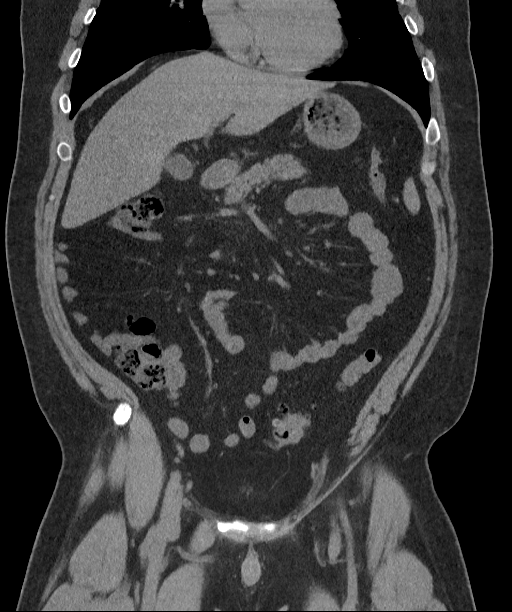
[im 70/158  bone]
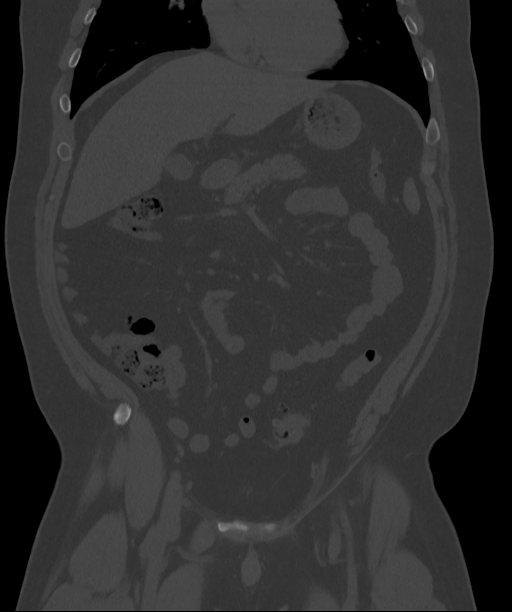
[im 88/158  soft-tissue]
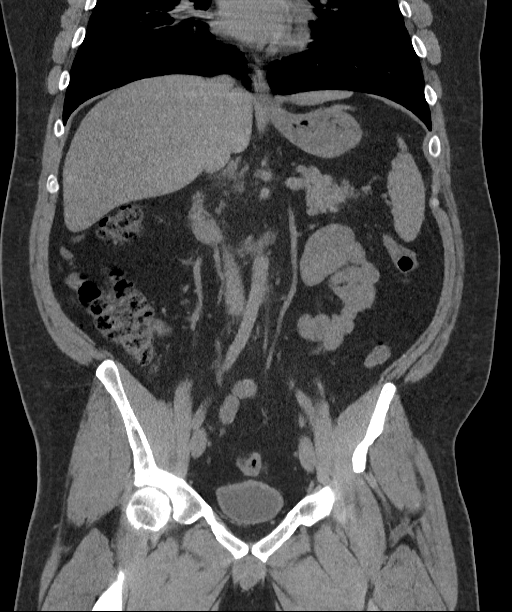

[Series 6: sagittal · sagittal · 0.70mm/px · 1 of 235 slices shown, 2 images]
[im 79/235  soft-tissue]
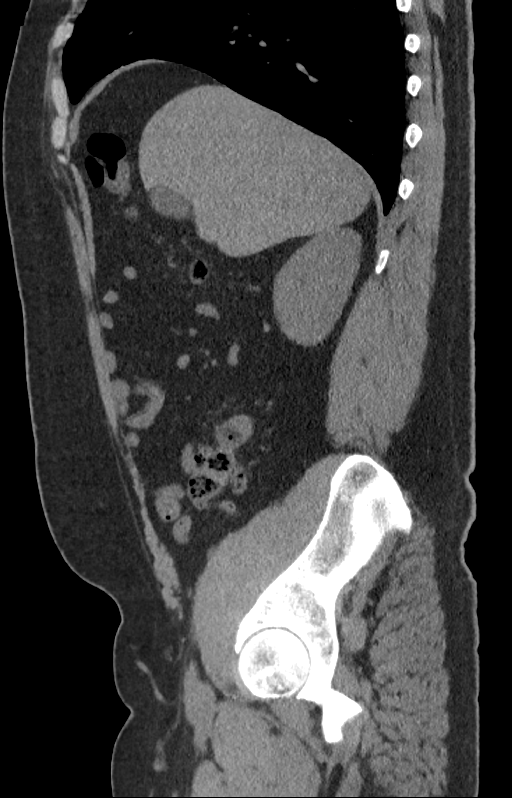
[im 79/235  bone]
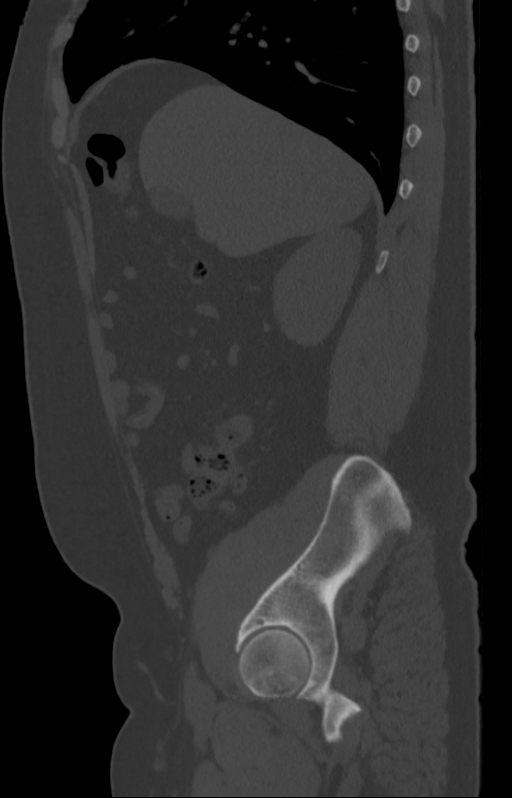

[10 of 46 positions shown; findings below may reference images not displayed]

FINDINGS: Lower chest: Minimal lingular scarring. Otherwise unremarkable.

Hepatobiliary: Unremarkable

Pancreas: Unremarkable

Spleen: Unremarkable

Adrenals/Urinary Tract: Nonobstructive left kidney lower pole
calculus measures 1.0 cm in long axis. No other urinary tract
calculi are identified. Adrenal glands unremarkable. No
hydronephrosis or hydroureter.

Stomach/Bowel: Normal appendix. Unremarkable appearance of the
terminal ileum and cecum. Scattered sigmoid colon diverticula.

Vascular/Lymphatic: No pathologic adenopathy. Minimal right common
iliac atherosclerotic calcification.

Reproductive: Unremarkable

Other: No supplemental non-categorized findings.

Musculoskeletal: Borderline left foraminal stenosis at L5-S1 due to
intervertebral and facet spurring along with mild disc bulge.
IMPRESSION: 1. Nonobstructive 1.0 cm left kidney lower pole calculus.
2. Normal appendix.
3. Scattered sigmoid colon diverticula.
4. Borderline left foraminal stenosis at L5-S1 due to intervertebral
and facet spurring.

## 2020-12-21 ENCOUNTER — Ambulatory Visit
Admission: EM | Admit: 2020-12-21 | Discharge: 2020-12-21 | Disposition: A | Discharge status: Home / Self Care | Payer: BC Managed Care – PPO | Attending: Physician Assistant | Admitting: Physician Assistant

## 2020-12-21 ENCOUNTER — Other Ambulatory Visit: Payer: Self-pay

## 2020-12-21 DIAGNOSIS — R109 Unspecified abdominal pain: Secondary | ICD-10-CM | POA: Diagnosis not present

## 2020-12-21 DIAGNOSIS — R110 Nausea: Secondary | ICD-10-CM | POA: Insufficient documentation

## 2020-12-21 DIAGNOSIS — T50905A Adverse effect of unspecified drugs, medicaments and biological substances, initial encounter: Secondary | ICD-10-CM | POA: Insufficient documentation

## 2020-12-21 DIAGNOSIS — R197 Diarrhea, unspecified: Secondary | ICD-10-CM | POA: Insufficient documentation

## 2020-12-21 LAB — CBC WITH DIFFERENTIAL/PLATELET
Abs Immature Granulocytes: 0.01 10*3/uL (ref 0.00–0.07)
Basophils Absolute: 0.1 10*3/uL (ref 0.0–0.1)
Basophils Relative: 1 %
Eosinophils Absolute: 0.3 10*3/uL (ref 0.0–0.5)
Eosinophils Relative: 4 %
HCT: 49.2 % (ref 39.0–52.0)
Hemoglobin: 17.4 g/dL — ABNORMAL HIGH (ref 13.0–17.0)
Immature Granulocytes: 0 %
Lymphocytes Relative: 23 %
Lymphs Abs: 1.8 10*3/uL (ref 0.7–4.0)
MCH: 30.3 pg (ref 26.0–34.0)
MCHC: 35.4 g/dL (ref 30.0–36.0)
MCV: 85.6 fL (ref 80.0–100.0)
Monocytes Absolute: 0.6 10*3/uL (ref 0.1–1.0)
Monocytes Relative: 7 %
Neutro Abs: 5 10*3/uL (ref 1.7–7.7)
Neutrophils Relative %: 65 %
Platelets: 175 10*3/uL (ref 150–400)
RBC: 5.75 MIL/uL (ref 4.22–5.81)
RDW: 13.5 % (ref 11.5–15.5)
WBC: 7.8 10*3/uL (ref 4.0–10.5)
nRBC: 0 % (ref 0.0–0.2)

## 2020-12-21 LAB — COMPREHENSIVE METABOLIC PANEL
ALT: 55 U/L — ABNORMAL HIGH (ref 0–44)
AST: 32 U/L (ref 15–41)
Albumin: 4.8 g/dL (ref 3.5–5.0)
Alkaline Phosphatase: 65 U/L (ref 38–126)
Anion gap: 8 (ref 5–15)
BUN: 15 mg/dL (ref 6–20)
CO2: 27 mmol/L (ref 22–32)
Calcium: 9.1 mg/dL (ref 8.9–10.3)
Chloride: 101 mmol/L (ref 98–111)
Creatinine, Ser: 1.36 mg/dL — ABNORMAL HIGH (ref 0.61–1.24)
GFR, Estimated: >60 mL/min (ref >60)
Glucose, Bld: 103 mg/dL — ABNORMAL HIGH (ref 70–99)
Potassium: 3.6 mmol/L (ref 3.5–5.1)
Sodium: 136 mmol/L (ref 135–145)
Total Bilirubin: 1 mg/dL (ref 0.3–1.2)
Total Protein: 7.8 g/dL (ref 6.5–8.1)

## 2020-12-21 LAB — LIPASE, BLOOD: Lipase: 37 U/L (ref 11–51)

## 2020-12-21 MED ORDER — ONDANSETRON HCL 4 MG PO TABS: 4.0000 mg | ORAL_TABLET | Freq: Three times a day (TID) | ORAL | 0 refills | Status: AC | PRN

## 2020-12-21 MED ORDER — SIMETHICONE 80 MG PO TABS: 80.0000 mg | ORAL_TABLET | Freq: Three times a day (TID) | ORAL | 0 refills | Status: AC | PRN

## 2020-12-21 NOTE — Discharge Instructions (Signed)
Your labs all look good today.  No changes from labs that you had done previously.  No evidence of acute infection.  I suspect that your symptoms are likely due to medication side effect.  Speak with your prescribing provider to see if taking this medication is right for you.  At this time, increase rest and fluids.  I have sent a couple medications to help with symptom management.  You should go to the emergency department for any severe abdominal pain, fevers or acute worsening of symptoms.

## 2020-12-21 NOTE — ED Triage Notes (Addendum)
Pt c/o increased gas and burping along with some diarrhea for about a week and a half. Pt also reports occasional nausea. Pt denies fevers. Pt also has some mid-low abdominal pain. Pt reports he started Ozempic several days prior to symptom onset.

## 2020-12-21 NOTE — ED Provider Notes (Signed)
[facility]    CSN: [id number] Arrival date & time: 12/21/20  1252      History   Chief Complaint Chief Complaint  Patient presents with  . Diarrhea  . GI Problem    HPI Cindi CarbonChris Axtman is a 40 y.o. male presenting for approximately 1.5-week history of abdominal cramping that is mild and intermittent, increased belching/burping/passing gas, approximately 3 episodes of diarrhea per day, and occasional nausea.  Patient denies any vomiting.  He denies any severe abdominal pains.  He has not had any black or bloody stools.  He has not had any fevers.  Admits to reduced appetite.  Patient states that he was started on Ozempic a couple days prior to onset of his symptoms.  He has had 2 doses of this medication with the last dose being 5 days ago.  He has taken Pepto-Bismol in the evenings to try to help with symptoms and says he thinks it may help.  He is not sure.  He denies any improvement or worsening of his symptoms.  He has had some history of acid reflux but denies any history of IBS or other GI problems.  He is not had any GI surgeries.  He says he was started on the Ozempic to help with insulin and for weight loss.  He is not diabetic.  He has no other concerns today.  HPI  Past Medical History:  Diagnosis Date  . Asthma     Patient Active Problem List   Diagnosis Date Noted  . Bronchitis 09/20/2017    Past Surgical History:  Procedure Laterality Date  . NO PAST SURGERIES         Home Medications    Prior to Admission medications   Medication Sig Start Date End Date Taking? Authorizing Provider  albuterol (PROVENTIL HFA;VENTOLIN HFA) 108 (90 Base) MCG/ACT inhaler Inhale 1-2 puffs into the lungs every 6 (six) hours as needed for wheezing or shortness of breath. 06/24/18  Yes Cook, Jayce G, DO  buPROPion (WELLBUTRIN XL) 150 MG 24 hr tablet Take 1 tablet by mouth daily. 12/08/20  Yes [provider]  loratadine (CLARITIN) 10 MG tablet Take 10 mg by mouth  daily. 12/05/20  Yes [provider]  losartan-hydrochlorothiazide (HYZAAR) 100-12.5 MG tablet Take 1 tablet by mouth daily. 06/08/20 06/08/21 Yes [provider]  ondansetron (ZOFRAN) 4 MG tablet Take 1 tablet (4 mg total) by mouth every 8 (eight) hours as needed for up to 7 days for nausea or vomiting. 12/21/20 12/28/20 Yes Eusebio FriendlyEaves, Jupiter Kabir B, PA-C  OZEMPIC, 0.25 OR 0.5 MG/DOSE, 2 MG/1.5ML SOPN PLEASE SEE ATTACHED FOR DETAILED DIRECTIONS 12/07/20  Yes [provider]  PARoxetine (PAXIL) 10 MG tablet Take 10 mg by mouth daily. 12/10/20  Yes [provider]  rosuvastatin (CRESTOR) 10 MG tablet Take 1 tablet by mouth daily. 07/17/20 07/17/21 Yes [provider]  Simethicone 80 MG TABS Take 1 tablet (80 mg total) by mouth 3 (three) times daily as needed for up to 10 days. 12/21/20 12/31/20 Yes Shirlee LatchEaves, Avin Gibbons B, PA-C  pantoprazole (PROTONIX) 40 MG tablet Take 1 tablet (40 mg total) by mouth daily. 09/13/19   Tommie Samsook, Jayce G, DO    Family History Family History  Problem Relation Age of Onset  . Hypertension Mother   . Hypertension Father   . AAA (abdominal aortic aneurysm) Paternal Aunt     Social History Social History   Tobacco Use  . Smoking status: Never Smoker  . Smokeless tobacco:  Never Used  Vaping Use  . Vaping Use: Never used  Substance Use Topics  . Alcohol use: No  . Drug use: No     Allergies   Patient has no known allergies.   Review of Systems Review of Systems  Constitutional: Negative for fatigue and fever.  Respiratory: Negative for shortness of breath.   Cardiovascular: Negative for chest pain.  Gastrointestinal: Positive for abdominal pain, diarrhea and nausea. Negative for abdominal distention, anal bleeding, blood in stool, constipation and vomiting.  Genitourinary: Negative for dysuria.  Musculoskeletal: Negative for arthralgias and myalgias.  Neurological: Negative for dizziness, weakness and headaches.     Physical  Exam Triage Vital Signs ED Triage Vitals  Enc Vitals Group     BP 12/21/20 1308 (!) 143/85     Pulse Rate 12/21/20 1308 67     Resp 12/21/20 1308 18     Temp 12/21/20 1308 98.3 F (36.8 C)     Temp Source 12/21/20 1308 Oral     SpO2 12/21/20 1308 100 %     Weight 12/21/20 1305 300 lb (136.1 kg)     Height 12/21/20 1305 6\' 1"  (1.854 m)     Head Circumference --      Peak Flow --      Pain Score 12/21/20 1304 2     Pain Loc --      Pain Edu? --      Excl. in GC? --    No data found.  Updated Vital Signs BP (!) 143/85 (BP Location: Left Arm)   Pulse 67   Temp 98.3 F (36.8 C) (Oral)   Resp 18   Ht 6\' 1"  (1.854 m)   Wt 300 lb (136.1 kg)   SpO2 100%   BMI 39.58 kg/m    Physical Exam Vitals and nursing note reviewed.  Constitutional:      General: He is not in acute distress.    Appearance: Normal appearance. He is well-developed. He is obese. He is not ill-appearing.  HENT:     Head: Normocephalic and atraumatic.  Eyes:     General: No scleral icterus.    Conjunctiva/sclera: Conjunctivae normal.  Cardiovascular:     Rate and Rhythm: Normal rate and regular rhythm.     Heart sounds: Normal heart sounds.  Pulmonary:     Effort: Pulmonary effort is normal. No respiratory distress.     Breath sounds: Normal breath sounds.  Abdominal:     General: Abdomen is protuberant. Bowel sounds are normal.     Palpations: Abdomen is soft.     Tenderness: There is abdominal tenderness (mild epigastric). There is no guarding or rebound.  Musculoskeletal:     Cervical back: Neck supple.  Skin:    General: Skin is warm and dry.  Neurological:     General: No focal deficit present.     Mental Status: He is alert. Mental status is at baseline.     Motor: No weakness.     Gait: Gait normal.  Psychiatric:        Mood and Affect: Mood normal.        Behavior: Behavior normal.        Thought Content: Thought content normal.      UC Treatments / Results  Labs (all labs  ordered are listed, but only abnormal results are displayed) Labs Reviewed  CBC WITH DIFFERENTIAL/PLATELET - Abnormal; Notable for the following components:      Result Value   Hemoglobin 17.4 (*)  All other components within normal limits  COMPREHENSIVE METABOLIC PANEL - Abnormal; Notable for the following components:   Glucose, Bld 103 (*)    Creatinine, Ser 1.36 (*)    ALT 55 (*)    All other components within normal limits  LIPASE, BLOOD    EKG   Radiology No results found.  Procedures Procedures (including critical care time)  Medications Ordered in UC Medications - No data to display  Initial Impression / Assessment and Plan / UC Course  I have reviewed the triage vital signs and the nursing notes.  Pertinent labs & imaging results that were available during my care of the patient were reviewed by me and considered in my medical decision making (see chart for details).   40 year old male presenting for 1.5-week history of abdominal cramping, nausea, increased gas and belching as well as diarrhea.  Started taking Ozempic a couple days prior to onset of symptoms.  He has not had any recent illnesses.  Vital signs are all stable in the clinic.  Blood pressure slightly elevated at 143/85.  He is afebrile.  He is overall well-appearing and in no acute distress.  He does have some mild epigastric tenderness but no guarding or rebound.  Normal bowel sounds.  Chest is clear to auscultation and heart regular rate and rhythm.  I suspect that his symptoms are likely secondary to the Ozempic use.  However, obtaining a CBC, CMP and lipase to assess for any acute abnormalities or indications of possible cholecystitis/cholelithiasis, or pancreatitis or other acute infection.  Patient has reached out to his PCP today but has not heard anything yet.  Reviewed CBC, CMP and lipase.  No acute abnormality or changes from when he had labs performed 2 weeks ago.  Reviewed this with  patient.  Advised him I believe his symptoms are secondary to the Ozempic.  He was able to get a hold of his prescribing provider who suggested treating the symptoms and monitoring the situation.  I have advised him to purchase over-the-counter Imodium to use as needed for diarrhea.  Also sent simethicone for gas and Zofran for nausea.  Advised increasing rest and fluids and eating bland foods.  Advised to keep close follow-up with his PCP.  I did review ED red flag signs and symptoms with patient.  Final Clinical Impressions(s) / UC Diagnoses   Final diagnoses:  Medication side effect, initial encounter  Abdominal cramping  Diarrhea, unspecified type  Nausea without vomiting     Discharge Instructions     Your labs all look good today.  No changes from labs that you had done previously.  No evidence of acute infection.  I suspect that your symptoms are likely due to medication side effect.  Speak with your prescribing provider to see if taking this medication is right for you.  At this time, increase rest and fluids.  I have sent a couple medications to help with symptom management.  You should go to the emergency department for any severe abdominal pain, fevers or acute worsening of symptoms.    ED Prescriptions    Medication Sig Dispense Auth. Provider   Simethicone 80 MG TABS Take 1 tablet (80 mg total) by mouth 3 (three) times daily as needed for up to 10 days. 30 tablet Eusebio Friendly B, PA-C   ondansetron (ZOFRAN) 4 MG tablet Take 1 tablet (4 mg total) by mouth every 8 (eight) hours as needed for up to 7 days for nausea or vomiting. 20 tablet  Shirlee Latch, PA-C     PDMP not reviewed this encounter.   Shirlee Latch, PA-C 12/21/20 1433
